# Patient Record
Sex: Female | Born: 1975 | Race: Black or African American | Hispanic: No | Smoking: Never smoker
Health system: Southern US, Community
[De-identification: ages and names within clinical notes are randomized; demographics above are authoritative.]

## PROBLEM LIST (undated history)

## (undated) DIAGNOSIS — K219 Gastro-esophageal reflux disease without esophagitis: Secondary | ICD-10-CM

## (undated) DIAGNOSIS — E0500 Thyrotoxicosis with diffuse goiter without thyrotoxic crisis or storm: Secondary | ICD-10-CM

## (undated) HISTORY — DX: Thyrotoxicosis with diffuse goiter without thyrotoxic crisis or storm: E05.00

## (undated) HISTORY — DX: Gastro-esophageal reflux disease without esophagitis: K21.9

---

## 1998-12-15 ENCOUNTER — Emergency Department (HOSPITAL_COMMUNITY): Admission: EM | Admit: 1998-12-15 | Discharge: 1998-12-15 | Payer: Self-pay | Admitting: Emergency Medicine

## 2001-02-11 ENCOUNTER — Ambulatory Visit (HOSPITAL_COMMUNITY): Admission: RE | Admit: 2001-02-11 | Discharge: 2001-02-11 | Payer: Self-pay | Admitting: Family Medicine

## 2001-02-11 ENCOUNTER — Encounter: Payer: Self-pay | Admitting: Family Medicine

## 2001-04-09 ENCOUNTER — Emergency Department (HOSPITAL_COMMUNITY): Admission: EM | Admit: 2001-04-09 | Discharge: 2001-04-10 | Payer: Self-pay | Admitting: Emergency Medicine

## 2001-09-04 ENCOUNTER — Emergency Department (HOSPITAL_COMMUNITY): Admission: EM | Admit: 2001-09-04 | Discharge: 2001-09-04 | Payer: Self-pay | Admitting: Emergency Medicine

## 2002-02-26 ENCOUNTER — Other Ambulatory Visit: Admission: RE | Admit: 2002-02-26 | Discharge: 2002-02-26 | Payer: Self-pay | Admitting: Internal Medicine

## 2004-07-05 ENCOUNTER — Ambulatory Visit: Payer: Self-pay | Admitting: Internal Medicine

## 2004-07-15 ENCOUNTER — Ambulatory Visit: Payer: Self-pay | Admitting: Internal Medicine

## 2005-10-17 ENCOUNTER — Ambulatory Visit: Payer: Self-pay | Admitting: Internal Medicine

## 2007-08-13 ENCOUNTER — Ambulatory Visit: Payer: Self-pay | Admitting: Internal Medicine

## 2007-08-13 DIAGNOSIS — M79609 Pain in unspecified limb: Secondary | ICD-10-CM | POA: Insufficient documentation

## 2007-08-14 ENCOUNTER — Encounter: Payer: Self-pay | Admitting: Internal Medicine

## 2007-08-14 LAB — CONVERTED CEMR LAB
ALT: 20 units/L (ref 0–35)
AST: 23 units/L (ref 0–37)
Albumin: 3 g/dL — ABNORMAL LOW (ref 3.5–5.2)
Alkaline Phosphatase: 126 units/L — ABNORMAL HIGH (ref 39–117)
Basophils Absolute: 0 10*3/uL (ref 0.0–0.1)
Basophils Relative: 0.3 % (ref 0.0–1.0)
Bilirubin, Direct: 0.1 mg/dL (ref 0.0–0.3)
Eosinophils Absolute: 0.2 10*3/uL (ref 0.0–0.6)
Eosinophils Relative: 1.7 % (ref 0.0–5.0)
HCT: 33.5 % — ABNORMAL LOW (ref 36.0–46.0)
Hemoglobin: 11.5 g/dL — ABNORMAL LOW (ref 12.0–15.0)
Lymphocytes Relative: 30.5 % (ref 12.0–46.0)
MCHC: 34.4 g/dL (ref 30.0–36.0)
MCV: 80.1 fL (ref 78.0–100.0)
Monocytes Absolute: 0.6 10*3/uL (ref 0.2–0.7)
Monocytes Relative: 6.1 % (ref 3.0–11.0)
Neutro Abs: 6.4 10*3/uL (ref 1.4–7.7)
Neutrophils Relative %: 61.4 % (ref 43.0–77.0)
Platelets: 361 10*3/uL (ref 150–400)
RBC: 4.19 M/uL (ref 3.87–5.11)
RDW: 16.3 % — ABNORMAL HIGH (ref 11.5–14.6)
Total Bilirubin: 0.5 mg/dL (ref 0.3–1.2)
Total Protein: 6.8 g/dL (ref 6.0–8.3)
WBC: 10.3 10*3/uL (ref 4.5–10.5)

## 2007-08-19 ENCOUNTER — Telehealth: Payer: Self-pay | Admitting: Internal Medicine

## 2007-08-29 DIAGNOSIS — E0500 Thyrotoxicosis with diffuse goiter without thyrotoxic crisis or storm: Secondary | ICD-10-CM

## 2007-08-29 HISTORY — DX: Thyrotoxicosis with diffuse goiter without thyrotoxic crisis or storm: E05.00

## 2007-09-18 ENCOUNTER — Encounter: Admission: RE | Admit: 2007-09-18 | Discharge: 2007-09-18 | Payer: Self-pay | Admitting: Obstetrics and Gynecology

## 2008-08-05 ENCOUNTER — Ambulatory Visit: Payer: Self-pay | Admitting: Internal Medicine

## 2008-08-05 DIAGNOSIS — J069 Acute upper respiratory infection, unspecified: Secondary | ICD-10-CM | POA: Insufficient documentation

## 2008-08-06 ENCOUNTER — Encounter: Admission: RE | Admit: 2008-08-06 | Discharge: 2008-08-06 | Payer: Self-pay | Admitting: Internal Medicine

## 2008-08-13 ENCOUNTER — Ambulatory Visit: Payer: Self-pay | Admitting: Internal Medicine

## 2008-08-14 LAB — CONVERTED CEMR LAB: TSH: 0.03 microintl units/mL — ABNORMAL LOW (ref 0.35–5.50)

## 2008-08-24 ENCOUNTER — Encounter: Payer: Self-pay | Admitting: Internal Medicine

## 2008-09-01 ENCOUNTER — Encounter (HOSPITAL_COMMUNITY): Admission: RE | Admit: 2008-09-01 | Discharge: 2008-09-02 | Payer: Self-pay | Admitting: Internal Medicine

## 2008-09-01 ENCOUNTER — Telehealth: Payer: Self-pay | Admitting: Internal Medicine

## 2008-09-03 ENCOUNTER — Telehealth: Payer: Self-pay | Admitting: Internal Medicine

## 2008-09-09 ENCOUNTER — Telehealth: Payer: Self-pay | Admitting: Internal Medicine

## 2008-09-16 ENCOUNTER — Ambulatory Visit: Payer: Self-pay | Admitting: Endocrinology

## 2008-09-17 ENCOUNTER — Telehealth: Payer: Self-pay | Admitting: Internal Medicine

## 2008-10-06 ENCOUNTER — Telehealth: Payer: Self-pay | Admitting: *Deleted

## 2008-10-07 ENCOUNTER — Ambulatory Visit (HOSPITAL_COMMUNITY): Admission: RE | Admit: 2008-10-07 | Discharge: 2008-10-07 | Payer: Self-pay | Admitting: Endocrinology

## 2008-10-22 ENCOUNTER — Encounter: Payer: Self-pay | Admitting: Endocrinology

## 2008-10-26 ENCOUNTER — Ambulatory Visit: Payer: Self-pay | Admitting: Endocrinology

## 2008-10-26 LAB — CONVERTED CEMR LAB
Free T4: 3.9 ng/dL — ABNORMAL HIGH (ref 0.6–1.6)
TSH: 0.06 microintl units/mL — ABNORMAL LOW (ref 0.35–5.50)

## 2008-11-02 ENCOUNTER — Ambulatory Visit: Payer: Self-pay | Admitting: Internal Medicine

## 2008-11-02 DIAGNOSIS — N61 Mastitis without abscess: Secondary | ICD-10-CM

## 2008-11-16 ENCOUNTER — Ambulatory Visit: Payer: Self-pay | Admitting: Endocrinology

## 2008-11-16 LAB — CONVERTED CEMR LAB: TSH: 0.06 microintl units/mL — ABNORMAL LOW (ref 0.35–5.50)

## 2008-11-20 ENCOUNTER — Telehealth (INDEPENDENT_AMBULATORY_CARE_PROVIDER_SITE_OTHER): Payer: Self-pay | Admitting: *Deleted

## 2008-12-16 ENCOUNTER — Ambulatory Visit: Payer: Self-pay | Admitting: Endocrinology

## 2008-12-16 DIAGNOSIS — R209 Unspecified disturbances of skin sensation: Secondary | ICD-10-CM

## 2008-12-16 LAB — CONVERTED CEMR LAB
BUN: 4 mg/dL — ABNORMAL LOW (ref 6–23)
Calcium: 9.1 mg/dL (ref 8.4–10.5)
Chloride: 111 meq/L (ref 96–112)
Creatinine, Ser: 0.7 mg/dL (ref 0.4–1.2)
Vitamin B-12: 548 pg/mL (ref 211–911)

## 2008-12-22 ENCOUNTER — Telehealth (INDEPENDENT_AMBULATORY_CARE_PROVIDER_SITE_OTHER): Payer: Self-pay | Admitting: *Deleted

## 2009-01-15 ENCOUNTER — Ambulatory Visit: Payer: Self-pay | Admitting: Endocrinology

## 2009-01-15 ENCOUNTER — Telehealth (INDEPENDENT_AMBULATORY_CARE_PROVIDER_SITE_OTHER): Payer: Self-pay | Admitting: *Deleted

## 2009-01-15 LAB — CONVERTED CEMR LAB
CO2: 27 meq/L (ref 19–32)
Chloride: 112 meq/L (ref 96–112)
Creatinine, Ser: 0.7 mg/dL (ref 0.4–1.2)
Glucose, Bld: 90 mg/dL (ref 70–99)
Sodium: 142 meq/L (ref 135–145)
TSH: 0.46 microintl units/mL (ref 0.35–5.50)

## 2009-01-21 ENCOUNTER — Telehealth: Payer: Self-pay | Admitting: Endocrinology

## 2009-03-02 ENCOUNTER — Ambulatory Visit: Payer: Self-pay | Admitting: Endocrinology

## 2009-03-05 ENCOUNTER — Ambulatory Visit: Payer: Self-pay | Admitting: Endocrinology

## 2009-03-05 LAB — CONVERTED CEMR LAB: TSH: 0.12 microintl units/mL — ABNORMAL LOW (ref 0.35–5.50)

## 2009-04-13 ENCOUNTER — Ambulatory Visit: Payer: Self-pay | Admitting: Endocrinology

## 2009-04-14 LAB — CONVERTED CEMR LAB: TSH: 0.22 microintl units/mL — ABNORMAL LOW (ref 0.35–5.50)

## 2009-06-10 ENCOUNTER — Telehealth: Payer: Self-pay | Admitting: Endocrinology

## 2009-06-11 ENCOUNTER — Ambulatory Visit: Payer: Self-pay | Admitting: Endocrinology

## 2009-06-11 DIAGNOSIS — L738 Other specified follicular disorders: Secondary | ICD-10-CM | POA: Insufficient documentation

## 2009-08-28 ENCOUNTER — Telehealth: Payer: Self-pay | Admitting: Family Medicine

## 2009-10-04 ENCOUNTER — Ambulatory Visit: Payer: Self-pay | Admitting: Endocrinology

## 2009-10-04 LAB — CONVERTED CEMR LAB: TSH: 0.26 microintl units/mL — ABNORMAL LOW (ref 0.35–5.50)

## 2009-10-09 ENCOUNTER — Ambulatory Visit: Payer: Self-pay | Admitting: Family Medicine

## 2009-10-09 ENCOUNTER — Encounter: Payer: Self-pay | Admitting: Endocrinology

## 2009-10-09 DIAGNOSIS — R05 Cough: Secondary | ICD-10-CM

## 2009-10-11 ENCOUNTER — Telehealth: Payer: Self-pay | Admitting: Endocrinology

## 2009-10-20 ENCOUNTER — Ambulatory Visit: Payer: Self-pay | Admitting: Endocrinology

## 2009-11-05 ENCOUNTER — Encounter: Payer: Self-pay | Admitting: Endocrinology

## 2009-11-29 ENCOUNTER — Telehealth: Payer: Self-pay | Admitting: Endocrinology

## 2009-11-29 ENCOUNTER — Ambulatory Visit: Payer: Self-pay | Admitting: Endocrinology

## 2009-11-29 DIAGNOSIS — R609 Edema, unspecified: Secondary | ICD-10-CM

## 2009-11-29 LAB — CONVERTED CEMR LAB
BUN: 5 mg/dL — ABNORMAL LOW (ref 6–23)
Creatinine, Ser: 0.7 mg/dL (ref 0.4–1.2)
Eosinophils Relative: 1.3 % (ref 0.0–5.0)
GFR calc non Af Amer: 122.02 mL/min (ref >60)
Lymphocytes Relative: 26.8 % (ref 12.0–46.0)
Monocytes Absolute: 0.5 10*3/uL (ref 0.1–1.0)
Monocytes Relative: 5 % (ref 3.0–12.0)
Neutrophils Relative %: 66.4 % (ref 43.0–77.0)
Platelets: 347 10*3/uL (ref 150.0–400.0)
TSH: 0.89 microintl units/mL (ref 0.35–5.50)
WBC: 9.3 10*3/uL (ref 4.5–10.5)

## 2009-11-30 ENCOUNTER — Encounter: Payer: Self-pay | Admitting: Endocrinology

## 2009-12-01 ENCOUNTER — Telehealth: Payer: Self-pay | Admitting: Endocrinology

## 2009-12-08 ENCOUNTER — Encounter: Payer: Self-pay | Admitting: Internal Medicine

## 2009-12-24 ENCOUNTER — Telehealth: Payer: Self-pay | Admitting: Endocrinology

## 2010-01-11 ENCOUNTER — Ambulatory Visit: Payer: Self-pay | Admitting: Endocrinology

## 2010-01-27 ENCOUNTER — Ambulatory Visit: Payer: Self-pay | Admitting: Internal Medicine

## 2010-04-18 ENCOUNTER — Ambulatory Visit: Payer: Self-pay | Admitting: Endocrinology

## 2010-04-19 ENCOUNTER — Ambulatory Visit: Payer: Self-pay | Admitting: Endocrinology

## 2010-04-25 ENCOUNTER — Telehealth: Payer: Self-pay | Admitting: Endocrinology

## 2010-04-26 ENCOUNTER — Encounter: Payer: Self-pay | Admitting: Internal Medicine

## 2010-04-27 ENCOUNTER — Ambulatory Visit: Payer: Self-pay | Admitting: Internal Medicine

## 2010-04-27 DIAGNOSIS — K219 Gastro-esophageal reflux disease without esophagitis: Secondary | ICD-10-CM | POA: Insufficient documentation

## 2010-05-02 LAB — CONVERTED CEMR LAB: IgE (Immunoglobulin E), Serum: 39.1 intl units/mL (ref 0.0–180.0)

## 2010-05-27 ENCOUNTER — Telehealth: Payer: Self-pay | Admitting: Internal Medicine

## 2010-06-27 ENCOUNTER — Ambulatory Visit: Payer: Self-pay | Admitting: Internal Medicine

## 2010-07-11 ENCOUNTER — Encounter: Payer: Self-pay | Admitting: Internal Medicine

## 2010-07-11 ENCOUNTER — Encounter: Payer: Self-pay | Admitting: Endocrinology

## 2010-08-26 ENCOUNTER — Ambulatory Visit: Payer: Self-pay | Admitting: Internal Medicine

## 2010-08-30 ENCOUNTER — Telehealth: Payer: Self-pay | Admitting: Internal Medicine

## 2010-09-14 ENCOUNTER — Ambulatory Visit: Admit: 2010-09-14 | Payer: Self-pay | Admitting: Internal Medicine

## 2010-09-15 ENCOUNTER — Telehealth: Payer: Self-pay | Admitting: Internal Medicine

## 2010-09-18 ENCOUNTER — Encounter: Payer: Self-pay | Admitting: Obstetrics and Gynecology

## 2010-09-18 ENCOUNTER — Encounter: Payer: Self-pay | Admitting: Internal Medicine

## 2010-09-19 ENCOUNTER — Encounter: Payer: Self-pay | Admitting: Obstetrics and Gynecology

## 2010-09-27 NOTE — Progress Notes (Signed)
Summary: Gwendolyn Scott  Phone Note Other Incoming   Caller: [employer] Gwendolyn Scott Lona Kettle [phone] Summary of Call: [employer] called to request that MD call on either 05/02 or 05/03 to discuss pt "Gwendolyn Scott" . If MD is available please contact Regina at the above number. Initial call taken by: Margaret Pyle, CMA,  December 24, 2009 3:11 PM  Follow-up for Phone Call        dr Felipa Furnace calls 12/27/09.  i told him i do not have a release of info to speak with him. Follow-up by: Minus Breeding MD,  Dec 27, 2009 2:55 PM

## 2010-09-27 NOTE — Letter (Signed)
Summary: Endocrinology/[facility]  Endocrinology/[facility]   Imported By: Lester Mesita 07/19/2010 07:12:13  _____________________________________________________________________  External Attachment:    Type:   Image     Comment:   External Document

## 2010-09-27 NOTE — Progress Notes (Signed)
Summary: Call Report  Phone Note Other Incoming   Caller: [facility] Call Report Summary of Call: [facility] Triage Call Report Triage Record Num: [id number] Operator: Caswell Corwin Patient Name: Gwendolyn Scott Call Date & Time: 11/27/2009 9:15:48AM Patient Phone: [phone] PCP: Romero Belling Patient Gender: Female PCP Fax : [phone] Patient DOB: 20-Jan-1976 Practice Name: [facility] Reason for Call: Pt calling that she ahs edema in hands and feet x 1 weeks and has some blood in her sputum. Also has tingling up to knees when dependent. Triaged edema and not able to do ADL's. Inst for home care and call back inst. Transferred to office for appt in 4 hrs. Protocol(s) Used: Edema, Generalized Atraumatic Recommended Outcome per Protocol: See Provider within 4 hours Reason for Outcome: Experienced sudden decrease in ability to carry out ADLs Care Advice:  ~ Call provider if symptoms worsen or new symptoms develop.  ~ Avoid any activity that produces symptoms until evaluated by provider.  ~ List, or take, all current prescription(s), OTC or alternative medication(s) to provider for evaluation.  ~ Rest in a reclining chair or elevate head and chest on 2 or 3 pillows when lying down to make breathing easier. Go to ED IMMEDIATELY if developing increased shortness of breath, continuous cough, worsening fatigue, or unable to perform ADLs.  ~  Initial call taken by: Margaret Pyle, CMA,  November 29, 2009 8:05 AM

## 2010-09-27 NOTE — Letter (Signed)
Summary: Pt's job Description/[facility]  Pt's job Description/[facility]   Imported By: Sherian Rein 11/10/2009 08:25:39  _____________________________________________________________________  External Attachment:    Type:   Image     Comment:   External Document

## 2010-09-27 NOTE — Consult Note (Signed)
Summary: [facility]  [facility]   Imported By: Maryln Gottron 01/18/2010 13:36:04  _____________________________________________________________________  External Attachment:    Type:   Image     Comment:   External Document

## 2010-09-27 NOTE — Assessment & Plan Note (Signed)
Summary: TINGLING LEGS-LEG ANKLE SWELLING-LITTLE BIT BLOOD IN [facility]   Vital Signs:  Patient profile:   35 year old female Height:      61 inches (154.94 cm) Weight:      238 pounds (108.18 kg) O2 Sat:      97 % on Room air Temp:     98.5 degrees F (36.94 degrees C) oral Pulse rate:   96 / minute BP sitting:   112 / 78  (left arm) Cuff size:   large  Vitals Entered By: Josph Macho RMA (November 29, 2009 8:48 AM)  O2 Flow:  Room air CC: Pt states she has tingling in her legs, legs & ankles swollen, and a little bit of blood in saliva X4days/ CF Is Patient Diabetic? No   CC:  Pt states she has tingling in her legs, legs & ankles swollen, and and a little bit of blood in saliva X4days/ CF.  History of Present Illness: pt states 1 week of slight swelling of the hands and feet, and associated tingling. she also has bloody saliva, which she feels is of nasopharyngeal, rather than chest, origin.    Current Medications (verified): 1)  Depo-Provera 150 Mg/ml Susp (Medroxyprogesterone Acetate) .... Take 1 Shot Q 3 Months 2)  Azithromycin 500 Mg Tabs (Azithromycin) .[name].. 1 Qd 3)  Benzonatate 100 Mg Caps (Benzonatate) .[name].. 1 Three Times A Day As Needed Cough  Allergies (verified): 1)  ! Penicillin V Potassium (Penicillin V Potassium)  Past History:  Past Medical History: Last updated: 10/04/2009 COUGH (ICD-786.2) FOLLICULITIS (ICD-704.8) ENCOUNTER FOR LONG-TERM USE OF OTHER MEDICATIONS (ICD-V58.69) HYPOTHYROIDISM, POST-RADIATION (ICD-244.1) NUMBNESS (ICD-782.0) INFLAMMATORY DISEASE OF BREAST (ICD-611.0) GOITER, DIFFUSE (ICD-240.9) URI (ICD-465.9) ARM PAIN (ICD-729.5)  Review of Systems  The patient denies dyspnea on exertion and prolonged cough.    Physical Exam  General:  obese.  no distress  Head:  head: no deformity eyes: no periorbital swelling, no proptosis external nose and ears are normal mouth: no lesion seen Neck:  i do not appreciate a goiter. Lungs:   Clear to auscultation bilaterally. Normal respiratory effort.  Msk:  hands are normal to my exam gait is normal and steady Extremities:  no edema Neurologic:  sensation is intact to touch on the hands Additional Exam:  FastTSH                   0.89 uIU/mL                 0.35-5.50   B-Type Natriuetic Peptide    11.0 pg/mL                  0.0-100.0    Sodium                    144 mEq/L                   135-145   Potassium            [L]  3.4 mEq/L                   3.5-5.1   Chloride                  108 mEq/L                   96-112   Carbon Dioxide            27 mEq/L  19-32   Glucose              [H]  119 mg/dL                   10-62   BUN                  [L]  5 mg/dL                     6-94   Creatinine                0.7 mg/dL                   8.5-4.6   Calcium                   9.0 mg/dL           Impression & Recommendations:  Problem # 1:  HYPERTHYROIDISM (ICD-242.90) Assessment Improved  Problem # 2:  EDEMA (ICD-782.3) not confirmed on exam or labs  Problem # 3:  bloody sputum of nasopharyngeal origin  Other Orders: T-D-Dimer Fibrin Derivatives Quantitive [phone]) TLB-TSH (Thyroid Stimulating Hormone) (84443-TSH) TLB-BNP (B-Natriuretic Peptide) (83880-BNPR) TLB-BMP (Basic Metabolic Panel-BMET) (80048-METABOL) TLB-CBC Platelet - w/Differential (85025-CBCD) Est. Patient Level IV (18299)  Patient Instructions: 1)  tests are being ordered for you today.  a few days after the test(s), please call [phone] to hear your test results. 2)  (update: i left message on phone-tree:  no rx needed now).

## 2010-09-27 NOTE — Progress Notes (Signed)
----   Converted from flag ---- ---- 04/24/2010 4:49 PM, Minus Breeding MD wrote: she sees dr in [place], so sounds like we are done  ---- 04/19/2010 5:02 PM, Hilarie Fredrickson wrote: Renne Musca WAS A NO-SHOW YESTERDAY AND TODAY.  SHE HAS CANCELLED 2 APPTS AND NO-SHOWED 5 APPTS SINCE FEB. 18, 2011.  DO YOU WANT [place] TO RE-SCHEDULE HER? PHONE [phone] ------------------------------  Phone Note Call from Patient    Follow-up for Phone Call        NOTED IN IDX "DO NOT SCHEDULE". Follow-up by: Hilarie Fredrickson,  April 25, 2010 9:16 AM

## 2010-09-27 NOTE — Progress Notes (Signed)
Summary: Letter  Phone Note Call from Patient Call back at Home Phone [phone]   Caller: Patient Summary of Call: Pt called stating that work excuse was not accepted by her employer because it needed to have her next scheduled work date as her return to work day. Pt states that she is off from work and does not return until 04/14. Pt is requesting a letter medically clearing her to return to work without retriction on April 14th. Initial call taken by: Margaret Pyle, CMA,  December 01, 2009 4:26 PM  Follow-up for Phone Call        sorry, i cannot certify disability until that date. Follow-up by: Minus Breeding MD,  December 01, 2009 5:23 PM  Additional Follow-up for Phone Call Additional follow up Details #1::        Left message for pt to return my call. Additional Follow-up by: Josph Macho RMA,  December 02, 2009 4:09 PM    Additional Follow-up for Phone Call Additional follow up Details #2::    pt informed Follow-up by: Margaret Pyle, CMA,  December 03, 2009 9:46 AM

## 2010-09-27 NOTE — Assessment & Plan Note (Signed)
Summary: poss sinus infection/jd   Primary Provider/Referring Provider:  Dr Cato Mulligan  CC:  Acute Visit.  pressure in cheeks and above eyes and nasal congestion with yellow to small amount of bright red blood - onset "Sunday.  Denies f/c/s..  History of Present Illness: History of Present Illness: April 27, 2010- 35yoF self-referred for allergy evaluation. She has had occasional feeling of throat swelling. Episodes usually come on in summertime, last about a week, respond to Dayquil. This has been happening 9-10 years and she associates it with outdoor exposure to weather changes and heat. With this she will sometimes get nasal congestion and drainage, sneezing, minor chest tightness, ears draining and frontal headache.  Grass mwing and colognes may cause or aggravate nasal syptoms. Denies sensitivity to molds, foods, medications including aspirin, latex or contrast.  Had ear infections as child but never had tubes, skin tests or prescription allergy meds.  No ENT surgery.Has GERD which doesn't wake her, treated with Tums. Never smoked. Had RAI for thyroid disease.  June 27, 2010- Allergic rhinitis, GERD Acute visit. 2 days of marked head congestion. She blames several days of being in and out of doors a lot with weather changes. Notes stopped up nose, frontal presure, not draining much. Ears and chest are ok. No sore throat or fever. Using Omnaris. Did not return for skin testing.  Allergy profile- Total IgE 39.1; no specific IgE elevations for common inhalant allergens.has never had flu or pneumococcal vaccine and not interested.   Preventive Screening-Counseling & Management  Alcohol-Tobacco     Smoking Status: never  Current Medications (verified): 1)  Depo-Provera 150 Mg/ml Susp (Medroxyprogesterone Acetate) .... Take 1 Shot Q 3 Months 2)  Methimazole 5 Mg Tabs (Methimazole) .... Take 1/2 By Mouth Once Daily 3)  Omnaris 50 Mcg/act Susp (Ciclesonide) .... 1-2 Puffs Each Nostril Every  Night At Bedtime 4)  Omeprazole 20 Mg Cpdr (Omeprazole) .... 1 Each Morning Before Breakfast.  Allergies (verified): 1)  ! Penicillin V Potassium (Penicillin V Potassium)  Past History:  Past Medical History: Last updated: 04/27/2010 COUGH (ICD-786.2) Allergic rhinitis FOLLICULITIS (ICD-704.8) ENCOUNTER FOR LONG-TERM USE OF OTHER MEDICATIONS (ICD-V58.69) HYPOTHYROIDISM, POST-RADIATION (ICD-244.1) NUMBNESS (ICD-782.0) INFLAMMATORY DISEASE OF BREAST (ICD-611.0) GOITER, DIFFUSE (ICD-240.9) URI (ICD-465.9) ARM PAIN (ICD-729.5)  Past Surgical History: Last updated: 04/27/2010 None  Family History: Last updated: 04/27/2010 mother has hyperthyroidism Allergies: grandmother and mother Asthma: grandmother and mother  Social History: Last updated: 04/27/2010 divorced Never Smoked 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  The patient denies shortness of breath with activity, shortness of breath at rest, productive cough, non-productive cough, coughing up blood, chest pain, irregular heartbeats, acid heartburn, indigestion, loss of appetite, weight change, abdominal pain, difficulty swallowing, sore throat, tooth/dental problems, headaches, itching, anxiety, rash, change in color of mucus, and fever.    Vital Signs:  Patient profile:   35 year old female Height:      61 inches Weight:      249.13 pounds O2 Sat:      98"  % on Room air Pulse rate:   80 / minute BP sitting:   122 / 76  (right arm) Cuff size:   large  Vitals Entered By: Gweneth Dimitri RN (June 27, 2010 3:40 PM)  O2 Flow:  Room air CC: Acute  Visit.  pressure in cheeks and above eyes, nasal congestion with yellow to small amount of bright red blood - onset Sunday.  Denies f/c/s. Comments Medications reviewed with  patient Daytime contact number verified with patient. Gweneth Dimitri RN  June 27, 2010 3:41 PM    Physical Exam  Additional Exam:  General: A/Ox3; pleasant and cooperative, NAD, pleasant affect SKIN: no rash, lesions NODES: no lymphadenopathy HEENT: [place]/AT, EOM- WNL, Conjuctivae- clear, PERRLA, TM-WNL, Nose- marked turbinate edema, Throat- clear and wnl, Mallampati  III, no drainage or erythema NECK: Supple w/ fair ROM, JVD- none, normal carotid impulses w/o bruits Thyroid-, no stridor CHEST: Clear to P&A HEART: RRR, no m/g/r heard ABDOMEN: overweight ZOX:WRUE, nl pulses, no edema  NEURO: Grossly intact to observation      Impression & Recommendations:  Problem # 1:  ? of ALLERGIC RHINITIS (ICD-477.9)  Exacerbation of nonspecific rhinitis. This may be more of a vasomotor rhinitis currently. I don't see a role yet for antibiotics. We can skin test later if indicated. For today I will try to get her open with a neb and depo, to where here Omnaris can regain control.  Her updated medication list for this problem includes:    Omnaris 50 Mcg/act Susp (Ciclesonide) .[name]... 1-2 puffs each nostril every night at bedtime  Medications Added to Medication List This Visit: 1)  Methimazole 5 Mg Tabs (Methimazole) .... Take 1/2 by mouth once daily  Other Orders: Est. Patient Level III (45409) Admin of Therapeutic Inj  intramuscular or subcutaneous (81191) Depo- Medrol 80mg  (J1040) Nebulizer Tx (47829) Influenza Vaccine NON MCR (56213)  Patient Instructions: 1)  Please schedule a follow-up appointment in 2 months. Call sooner if needed. 2)  neb neo nasal 3)  depo 80 4)  Continue using Omnaris nasal spray 1-2 sprays each nostril once daily at  bedtime.  5)  Flu vax   Immunizations Administered:  Influenza Vaccine # 1:    Vaccine Type: Fluvax Non-MCR    Site: left deltoid    Mfr: novartis    Dose: 0.5 ml    Route: IM    Given by: Reynaldo Minium CMA    Exp. Date: 01/27/2011     Lot #: [id number]    VIS given: 03/22/10 version given June 27, 2010.  Flu Vaccine Consent Questions:    Do you have a history of severe allergic reactions to this vaccine? no    Any prior history of allergic reactions to egg and/or gelatin? no    Do you have a sensitivity to the preservative Thimersol? no    Do you have a past history of Guillan-Barre Syndrome? no    Do you currently have an acute febrile illness? no    Have you ever had a severe reaction to latex? no    Vaccine information given and explained to patient? yes    Are you currently pregnant? no    Medication Administration  Injection # 1:    Medication: Depo- Medrol 80mg     Diagnosis: ? of ALLERGIC RHINITIS (ICD-477.9)    Route: SQ    Site: RUOQ gluteus    Exp Date: 01/2013    Lot #: [id number]    Mfr: Pharmacia    Patient tolerated injection without complications    Given by: Reynaldo Minium CMA (June 27, 2010 5:03 PM)  Medication # 1:    Medication: EMR miscellaneous medications    Diagnosis: ? of ALLERGIC RHINITIS (ICD-477.9)    Dose: 3 drops  Route: intranasal    Exp Date: 11/2011    Lot #: [id number]    Mfr: bayer    Comments: Neo-synephrine    Patient tolerated medication without complications    Given by: Reynaldo Minium CMA (June 27, 2010 5:04 PM)  Orders Added: 1)  Est. Patient Level III [11914] 2)  Admin of Therapeutic Inj  intramuscular or subcutaneous [96372] 3)  Depo- Medrol 80mg  [J1040] 4)  Nebulizer Tx [94640] 5)  Influenza Vaccine NON MCR [00028]

## 2010-09-27 NOTE — Letter (Signed)
Summary: [facility]   [facility]   Imported By: Maryln Gottron 07/27/2010 12:35:13  _____________________________________________________________________  External Attachment:    Type:   Image     Comment:   External Document

## 2010-09-27 NOTE — Letter (Signed)
Summary: [facility]  [facility] -[facility]   Imported By: Maryln Gottron 06/07/2010 13:12:39  _____________________________________________________________________  External Attachment:    Type:   Image     Comment:   External Document

## 2010-09-27 NOTE — Assessment & Plan Note (Signed)
Summary: sore throat, achey/cd   Vital Signs:  Patient profile:   35 year old female Height:      61 inches (154.94 cm) Weight:      238 pounds (108.18 kg) O2 Sat:      97 % on Room air Temp:     99.2 degrees F (37.33 degrees C) oral Pulse rate:   102 / minute BP sitting:   126 / 88  (left arm) Cuff size:   large  Vitals Entered By: Josph Macho CMA (October 04, 2009 10:54 AM)  O2 Flow:  Room air CC: Sore throat, "achy" feeling, and green mucus/ pt states the only medication she is taking is the Depo-Provera/  CF Is Patient Diabetic? No   CC:  Sore throat, "achy" feeling, and and green mucus/ pt states the only medication she is taking is the Depo-Provera/  CF.  History of Present Illness: pt had i-131 rx for hyperthyroidism, and has not required any thyroid medication recently (synthroid was stopped). pt states few days of sore throat, nasal congestion, and associated moderate fever (101). she says the folliculitis at her right axilla is much better.  Current Medications (verified): 1)  Depo-Provera 150 Mg/ml Susp (Medroxyprogesterone Acetate) .... Take 1 Shot Q 3 Months 2)  Spironolactone 25 Mg Tabs (Spironolactone) .... Take 2 By Mouth Qd 3)  Lactulose 10 Gm/34ml Soln (Lactulose) .... 3 Tablespoons Tid 4)  Metoprolol Succinate 25 Mg Xr24h-Tab (Metoprolol Succinate) .... 1/2 Once Daily 5)  Doxycycline Hyclate 100 Mg Caps (Doxycycline Hyclate) .[name].. 1 Bid  Allergies (verified): 1)  ! Penicillin V Potassium (Penicillin V Potassium)  Past History:  Past Medical History: COUGH (ICD-786.2) FOLLICULITIS (ICD-704.8) ENCOUNTER FOR LONG-TERM USE OF OTHER MEDICATIONS (ICD-V58.69) HYPOTHYROIDISM, POST-RADIATION (ICD-244.1) NUMBNESS (ICD-782.0) INFLAMMATORY DISEASE OF BREAST (ICD-611.0) GOITER, DIFFUSE (ICD-240.9) URI (ICD-465.9) ARM PAIN (ICD-729.5)  Review of Systems       denies earache.  she has a dry cough.    Physical Exam  General:  obese.  no  distress  Head:  head: no deformity eyes: no periorbital swelling, no proptosis external nose and ears are normal mouth: no lesion seen Lungs:  Clear to auscultation bilaterally. Normal respiratory effort.  Skin:  right axilla: no rash Additional Exam:   FastTSH              [L]  0.26 uIU/mL    Impression & Recommendations:  Problem # 1:  HYPERTHYROIDISM (ICD-242.90) recurrent, mild  Problem # 2:  URI (ICD-465.9)  Problem # 3:  FOLLICULITIS (ICD-704.8) Assessment: Improved  Medications Added to Medication List This Visit: 1)  Azithromycin 500 Mg Tabs (Azithromycin) .[name].. 1 qd 2)  Benzonatate 100 Mg Caps (Benzonatate) .[name].. 1 three times a day as needed cough  Other Orders: Est. Patient Level III ([address]) Est. Patient Level IV (60454) TLB-TSH (Thyroid Stimulating Hormone) (84443-TSH)  Patient Instructions: 1)  azithromycin 500 mg once daily 2)  loratadine-d (non-prescription) as needed for congestion 3)  benzonatate 100 mg three times a day as needed for cough. 4)  carefully wash your right underarm at least daily. 5)  thyroid blood test today. 6)  tests are being ordered for you today.  a few days after the test(s), please call [phone] to hear your test results. 7)  return 6 months 8)  (update: i left message on phone-tree:  ret 3 mos.  you may need another i-131 rx.) Prescriptions: BENZONATATE 100 MG CAPS (BENZONATATE) 1 three times a day as needed cough  #  30 x 1   Entered and Authorized by:   Minus Breeding MD   Signed by:   Minus Breeding MD on 10/04/2009   Method used:   Electronically to        [facility] (retail)       [address]       [place], [place]  [address]       Ph: [phone]       Fax: [phone]   RxID:   [id number] AZITHROMYCIN 500 MG TABS (AZITHROMYCIN) 1 qd  #6 x 0   Entered and Authorized by:   Minus Breeding MD   Signed by:   Minus Breeding MD on 10/04/2009   Method used:   Electronically to         [facility] (retail)       [address]       [place], [place]  [address]       Ph: [phone]       Fax: [phone]   RxID:   [id number]

## 2010-09-27 NOTE — Letter (Signed)
Summary: Out of Work  [facility]  [address] [place], [place] [address]   Phone: [phone]  Fax: [phone]    November 30, 2009   Employee:  JULIANI LADUKE    To Whom It May Concern:   For Medical reasons, please excuse the above named employee from work for the following dates:  Start: 11/29/09    End:  12/01/09         Sincerely,    Minus Breeding MD

## 2010-09-27 NOTE — Progress Notes (Signed)
Summary: nos appt  Phone Note Call from Patient   Caller: [email or web address]  Call For: Starlena Beil Summary of Call: LMTCB x2 to rsc nos from 9/29. Initial call taken by: Darletta Moll,  May 27, 2010 3:41 PM

## 2010-09-27 NOTE — Assessment & Plan Note (Signed)
Summary: BAD COUGH--WAS SEEN ON MONDAY//VGJ   Vital Signs:  Patient profile:   35 year old female Weight:      237 pounds Temp:     98.0 degrees F oral BP sitting:   122 / 84  (left arm) Cuff size:   large  Vitals Entered By: Alfred Levins, CMA (October 09, 2009 10:12 AM) CC: cough x5 days   History of Present Illness: Pt here from working in the clinic. She is having significant cough. She is not heving fever or chills, no ear pain, no headache, min rhinitis, no ST, significant cough minimally productive clear to white phlegm. She has chest congestion. She has taken Zmax, taking Tessalon regularly, is taking Loratidine.  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 Take 1 Shot Q 3 Months 2)  Spironolactone 25 Mg Tabs (Spironolactone) .... Take 2 By Mouth Qd 3)  Lactulose 10 Gm/7ml Soln (Lactulose) .... 3 Tablespoons Tid 4)  Metoprolol Succinate 25 Mg Xr24h-Tab (Metoprolol Succinate) .... 1/2 Once Daily 5)  Doxycycline Hyclate 100 Mg Caps (Doxycycline Hyclate) .[name].. 1 Bid 6)  Azithromycin 500 Mg Tabs (Azithromycin) .[name].. 1 Qd 7)  Benzonatate 100 Mg Caps (Benzonatate) .[name].. 1 Three Times A Day As Needed Cough  Allergies: 1)  ! Penicillin V Potassium (Penicillin V Potassium)  Physical Exam  General:  morbidly obese.  no distress, coughinbg frequently.  Head:  normocephalic and atraumatic. Sinuses NT.  Eyes:  Conjunctiva clear bilaterally.  Ears:  External ear exam shows no significant lesions or deformities.  Otoscopic examination reveals clear canals, tympanic membranes are intact bilaterally without  bulging, retraction, inflammation or discharge. Hearing is grossly normal bilaterally. Nose:  External nasal examination shows no deformity or inflammation. Nasal mucosa are pink and moist without lesions or exudates. Minimal inflamm bilat. Mouth:  Oral mucosa and oropharynx without lesions or exudates.  Teeth in good repair. Neck:  No deformities, masses, or tenderness noted. Lungs:  Normal respiratory effort, chest expands symmetrically. Lungs are clear to auscultation, no crackles or wheezes. Heart:  Normal rate and regular rhythm. S1 and S2 normal without gallop, murmur, click, rub or other extra sounds.   Impression & Recommendations:  Problem # 1:  COUGH (ICD-786.2) See instructions.  Complete Medication List: 1)  Depo-provera 150 Mg/ml Susp (Medroxyprogesterone acetate) .... Take 1 shot q 3 months 2)  Spironolactone 25 Mg Tabs (Spironolactone) .... Take 2 by mouth qd 3)  Lactulose 10 Gm/56ml Soln (Lactulose) .... 3 tablespoons tid 4)  Metoprolol Succinate 25 Mg Xr24h-tab (Metoprolol succinate) .... 1/2 once daily 5)  Doxycycline Hyclate 100 Mg Caps (Doxycycline hyclate) .[name].. 1 bid 6)  Azithromycin 500 Mg Tabs (Azithromycin) .[name].. 1 qd 7)  Benzonatate 100 Mg Caps (Benzonatate) .[name].. 1 three times a day as needed cough  Patient Instructions: 1)  Take Tessalon three times a day  2)  Take Guaifenesin by going to [facility] or [facility] and getting MUCOUS RELIEF EXPECTORANT (400mg ), take 11/2 tabs by mouth AM and NOON. 3)  Drink lots of fluids anytime taking Guaifenesin.  4)  Keep Lozenge in mouth. 5)  Gargle every 1/2 hr  with warm salt water.  6)  Tussionex atnight.

## 2010-09-27 NOTE — Assessment & Plan Note (Signed)
Summary: allergies/jd   Vital Signs:  Patient profile:   35 year old female Height:      61 inches Weight:      247.13 pounds BMI:     46.86 O2 Sat:      98 % on Room air Pulse rate:   74 / minute BP sitting:   110 / 70  (left arm) Cuff size:   large  Vitals Entered By: Reynaldo Minium CMA (April 27, 2010 9:18 AM)  O2 Flow:  Room air CC: Allergy new pt-self referral   Primary Provider/Referring Provider:  Dr Cato Mulligan  CC:  Allergy new pt-self referral.  History of Present Illness: April 27, 2010- 35yoF self-referred for allergy evaluation. She has had occasional feeling of throat swelling. Episodes usually come on in summertime, last about a week, respond to Dayquil. This has been happening 9-10 years and she associates it with outdoor exposure to weather changes and heat. With this she will sometimes get nasal congestion and drainage, sneezing, minor chest tightness, ears draining and frontal headache.  Grass mwing and colognes may cause or aggravate nasal syptoms. Denies sensitivity to molds, foods, medications including aspirin, latex or contrast.  Had ear infections as child but never had tubes, skin tests or prescription allergy meds.  No ENT surgery.Has GERD which doesn't wake her, treated with Tums. Never smoked. Had RAI for thyroid disease.  Preventive Screening-Counseling & Management  Alcohol-Tobacco     Smoking Status: never     Tobacco Counseling: not indicated; no tobacco use  Current Medications (verified): 1)  Depo-Provera 150 Mg/ml Susp (Medroxyprogesterone Acetate) .... Take 1 Shot Q 3 Months 2)  Methimazole 5 Mg Tabs (Methimazole) .... Take 1 By Mouth Once Daily  Allergies (verified): 1)  ! Penicillin V Potassium (Penicillin V Potassium)  Past History:  Family History: Last updated: 04/27/2010 mother has hyperthyroidism Allergies: grandmother and mother Asthma: grandmother and mother  Social History: Last updated: 04/27/2010 divorced Never  Smoked works at [employer] Lives with Grandparents  Risk Factors: Smoking Status: never (04/27/2010)  Past Medical History: COUGH (ICD-786.2) Allergic rhinitis FOLLICULITIS (ICD-704.8) ENCOUNTER FOR LONG-TERM USE OF OTHER MEDICATIONS (ICD-V58.69) HYPOTHYROIDISM, POST-RADIATION (ICD-244.1) NUMBNESS (ICD-782.0) INFLAMMATORY DISEASE OF BREAST (ICD-611.0) GOITER, DIFFUSE (ICD-240.9) URI (ICD-465.9) ARM PAIN (ICD-729.5)  Past Surgical History: None  Family History: mother has hyperthyroidism Allergies: grandmother and mother Asthma: grandmother and mother  Social History: divorced Never Smoked works at [employer] with Grandparents  Review of Systems      See HPI       The patient complains of weight change, difficulty swallowing, sore throat, nasal congestion/difficulty breathing through nose, and ear ache.  The patient denies shortness of breath with activity, shortness of breath at rest, productive cough, non-productive cough, coughing up blood, chest pain, irregular heartbeats, acid heartburn, indigestion, loss of appetite, abdominal pain, tooth/dental problems, headaches, sneezing, itching, anxiety, depression, hand/feet swelling, joint stiffness or pain, rash, change in color of mucus, and fever.    Physical Exam  Additional Exam:  General: A/Ox3; pleasant and cooperative, NAD, pleasant affect SKIN: no rash, lesions NODES: no lymphadenopathy HEENT: [place]/AT, EOM- WNL, Conjuctivae- clear, PERRLA, TM-WNL, Nose- clear, Throat- clear and wnl, Mallampati  III, no drainage or erythema NECK: Supple w/ fair ROM, JVD- none, normal carotid impulses w/o bruits Thyroid-, no stridor CHEST: Clear to P&A HEART: RRR, no m/g/r heard ABDOMEN: Soft and nl; nml bowel sounds; no organomegaly or masses noted, overweight YQM:VHQI, nl pulses, no edema  NEURO: Grossly  intact to observation      Impression & Recommendations:  Problem # 1:  ? of ALLERGIC  RHINITIS (ICD-477.9)  The nasal congestion, blowing and sneezing, and frontal headache are suggestive. The midsummer peak season is more like an air quality irritant effect. We will look for IgE activation. Discussed environmental precautions and will give a sample steroid nasal spray.  Her updated medication list for this problem includes:    Omnaris 50 Mcg/act Susp (Ciclesonide) .[name]... 1-2 puffs each nostril every night at bedtime  Problem # 2:  GERD (ICD-530.81)  She freely admits reflux/ heartburn, which is treated with TUMS. Denies nocturnal choking/ strangling.This may explain or contribute to her throat complaints. We discussed reflux and suggested trial of an otc acid blocker. Se does say her throat flares usually at the same time as ner nasal congestion.  Her updated medication list for this problem includes:    Omeprazole 20 Mg Cpdr (Omeprazole) .[name]... 1 each morning before breakfast.  Problem # 3:  HYPERTHYROIDISM (ICD-242.90) I don't think her previous thyroid disease is the basis for intermittent throat symptoms now, unless simple dryness is part of her complaint. Her updated medication list for this problem includes:    Methimazole 5 Mg Tabs (Methimazole) .[name]... Take 1 by mouth once daily  Medications Added to Medication List This Visit: 1)  Methimazole 5 Mg Tabs (Methimazole) .... Take 1 by mouth once daily 2)  Omnaris 50 Mcg/act Susp (Ciclesonide) .[name].. 1-2 puffs each nostril every night at bedtime 3)  Omeprazole 20 Mg Cpdr (Omeprazole) .[name].. 1 each morning before breakfast.  Other Orders: Consultation Level IV (33295) T-Allergy Profile [place], [place], [place], , [place] [phone])  Patient Instructions: 1)  Return as able for allergy skin testing. ;Stop all antihistamines 3 days before skin testing, including cold and allergy meds, otc sleep and cough meds.  2)  Lab 3)  Sample Omnaris nasal spray-: 1 or 2 puffs each nostril, once daily every night at bedtime 4)  Try otc acid blocker  Omeprazole 20 mg, 1 daily before breakfast. Try it for a month to see if it prevents acid reflux/ heart burn. Does it help with your throat problems? Prescriptions: OMEPRAZOLE 20 MG CPDR (OMEPRAZOLE) 1 each morning before breakfast.  #1 bottle x prn   Entered and Authorized by:   Waymon Budge MD   Signed by:   Waymon Budge MD on 04/27/2010   Method used:   Historical   RxID:   [id number] OMNARIS 50 MCG/ACT SUSP (CICLESONIDE) 1-2 puffs each nostril every night at bedtime  #1 x 0   Entered and Authorized by:   Waymon Budge MD   Signed by:   Waymon Budge MD on 04/27/2010   Method used:   Samples Given   RxID:   [id number]

## 2010-09-27 NOTE — Letter (Signed)
Summary: Call A Nurse  Call A Nurse   Imported By: Sherian Rein 10/13/2009 13:12:36  _____________________________________________________________________  External Attachment:    Type:   Image     Comment:   External Document

## 2010-09-27 NOTE — Progress Notes (Signed)
Summary: Call Report  Phone Note Other Incoming   Caller: Call-A-Nurse Call Report Summary of Call: pt called 10/09/2009 @ 8:41am stating that she has persistant cough even with Benzonatate. pt was advised to see MD within 24hrs. pt told to call at 9am to schedule. pt made appt same day with Dr. Hetty Ely @ 9:am Initial call taken by: Margaret Pyle, CMA,  October 11, 2009 10:13 AM

## 2010-09-27 NOTE — Progress Notes (Signed)
  Phone Note Call from Patient Call back at [facility] Phone [phone]   Caller: Patient Summary of Call: Onset yesterday of hive lesions diffusely on body.  She questions food allergy though known prior hx.  No recent new meds, change of detergents, or any other obvious precipitants.  Has noted taken any meds for relief.  No dyspnea, wheezing, or angioedema symptoms.  Pt advised to try OTC antihistamine such as Zyrtec and may add Pepcid/Zantac as needed. Initial call taken by: Evelena Peat MD,  August 28, 2009 8:43 AM

## 2010-09-27 NOTE — Progress Notes (Signed)
Summary: work release  Phone Note Call from Patient   Caller: Patient [phone] Summary of Call: pt called requesting a note from MD releasing her back to work with no restrictions.Pt is not scheduled to retun to work until 12/09/2009. Initial call taken by: Margaret Pyle, CMA,  November 29, 2009 10:34 AM  Follow-up for Phone Call        on what day did you become unable to work?  Additional Follow-up for Phone Call Additional follow up Details #1::        left message on machine for pt to return my call   pt's start date for work note would be today. pt left early for appt due to leg edema Additional Follow-up by: Margaret Pyle, CMA,  November 29, 2009 2:43 PM    Additional Follow-up for Phone Call Additional follow up Details #2::    i called pt 11/30/09.  i left message on ans machine.  labs do not indicate a need for further absence from work.  i wrote note for rtw 12/01/09. Follow-up by: Minus Breeding MD,  November 30, 2009 9:02 AM  Additional Follow-up for Phone Call Additional follow up Details #3:: Details for Additional Follow-up Action Taken: pt is not scheduled to work until 04/14 (pre-scheduled time off). Per pt her employer, pt will need clearance from MD because she left work early due to edema for OV 04/04. Margaret Pyle, CMA  November 30, 2009 9:23 AM   (pt picked up letter)

## 2010-09-29 NOTE — Progress Notes (Signed)
Summary: nos appt  Phone Note Call from Patient   Caller: [email or web address]  Call For: young Summary of Call: Rsc nos from 12/30 to 1/18. Initial call taken by: Darletta Moll,  August 30, 2010 11:22 AM

## 2010-09-29 NOTE — Progress Notes (Signed)
Summary: nos appt  Phone Note Call from Patient   Caller: [email or web address]  Call For: young Summary of Call: ATC pt to rsc nos from 1/18, no vm. Initial call taken by: Darletta Moll,  September 15, 2010 10:05 AM

## 2010-11-19 ENCOUNTER — Emergency Department (HOSPITAL_BASED_OUTPATIENT_CLINIC_OR_DEPARTMENT_OTHER)
Admission: EM | Admit: 2010-11-19 | Discharge: 2010-11-19 | Disposition: A | Discharge status: Home / Self Care | Payer: Self-pay | Attending: Emergency Medicine | Admitting: Emergency Medicine

## 2010-11-19 DIAGNOSIS — Z9109 Other allergy status, other than to drugs and biological substances: Secondary | ICD-10-CM | POA: Insufficient documentation

## 2010-11-19 DIAGNOSIS — J309 Allergic rhinitis, unspecified: Secondary | ICD-10-CM | POA: Insufficient documentation

## 2010-11-19 DIAGNOSIS — J329 Chronic sinusitis, unspecified: Secondary | ICD-10-CM | POA: Insufficient documentation

## 2010-11-19 DIAGNOSIS — E039 Hypothyroidism, unspecified: Secondary | ICD-10-CM | POA: Insufficient documentation

## 2010-11-21 ENCOUNTER — Telehealth: Payer: Self-pay | Admitting: Internal Medicine

## 2010-11-21 ENCOUNTER — Telehealth: Payer: Self-pay | Admitting: Pulmonary Disease

## 2010-11-21 NOTE — Telephone Encounter (Signed)
ATC pt-unable to leave a message.

## 2010-11-21 NOTE — Telephone Encounter (Signed)
Error- duplicate msg. Gwendolyn Scott  °

## 2010-11-22 NOTE — Telephone Encounter (Signed)
Pt called back (didn't know why she didn't get a msg on her cell- says it's working fine). [phone]. Gwendolyn Scott

## 2010-11-22 NOTE — Telephone Encounter (Signed)
Called and spoke with pt and she stated that she is having problems with allergies/sinus drainage that is worse in the evening, cough that is dry, hoarsness.  Pt is requesting recs for this please.  thanks

## 2010-11-22 NOTE — Telephone Encounter (Signed)
Per-CDY suggest OTC combo like tylenol allergy and sinus or fexofenadine 180mg  plus Delsym cough syrup. Julaine Hua, CMA   Pt informed of CDY recs. Julaine Hua, CMA

## 2010-12-13 ENCOUNTER — Ambulatory Visit (INDEPENDENT_AMBULATORY_CARE_PROVIDER_SITE_OTHER): Payer: Self-pay | Admitting: Adult Health

## 2010-12-13 ENCOUNTER — Encounter: Payer: Self-pay | Admitting: Adult Health

## 2010-12-13 ENCOUNTER — Ambulatory Visit: Payer: Self-pay | Admitting: Internal Medicine

## 2010-12-13 DIAGNOSIS — J069 Acute upper respiratory infection, unspecified: Secondary | ICD-10-CM

## 2010-12-13 LAB — HCG, SERUM, QUALITATIVE: Preg, Serum: NEGATIVE

## 2010-12-13 MED ORDER — AZITHROMYCIN 250 MG PO TABS: 250.0000 mg | ORAL_TABLET | Freq: Every day | ORAL | Status: AC

## 2010-12-13 NOTE — Progress Notes (Signed)
  Subjective:    Patient ID: Gwendolyn Scott, female    DOB: 1975-10-10, 36 y.o.   MRN: [id number]  HPI 35 yo AAF with known hx of Allergic Rhinitis   12/13/2010 Acute OV Pt complains of 3 days of nasal congestion, drainage, sore throat, cough, minimally productive.  Feels a lot of post nasal drainage. Has ear fullness and ache in ears at times. Especially after coughing.  No fever or chills. No otc used. No discolored mucus     Review of Systems Constitutional:   No  weight loss, night sweats,  Fevers, chills, fatigue, or  lassitude.  HEENT:   No headaches,  Difficulty swallowing,  Tooth/dental problems, +Sore throat,               + sneezing, itching, ear ache, nasal congestion, post nasal drip,   CV:  No chest pain,  Orthopnea, PND, swelling in lower extremities, anasarca, dizziness, palpitations, syncope.   GI  No heartburn, indigestion, abdominal pain, nausea, vomiting, diarrhea, change in bowel habits, loss of appetite, bloody stools.   Resp: No shortness of breath with exertion or at rest.  No excess mucus, no productive cough,  No non-productive cough,  No coughing up of blood.  No change in color of mucus.  No wheezing.  No chest wall deformity  Skin: no rash or lesions.  GU: no dysuria, change in color of urine, no urgency or frequency.  No flank pain, no hematuria   MS:  No joint pain or swelling.  No decreased range of motion.  No back pain.  Psych:  No change in mood or affect. No depression or anxiety.  No memory loss.       Objective:   Physical Exam GEN: A/Ox3; pleasant , NAD, well nourished   HEENT:  [place]/AT,  EACs-clear, TMs-wnl, NOSE-clear, THROAT-clear, no lesions, no postnasal drip or exudate noted.   NECK:  Supple w/ fair ROM; no JVD; normal carotid impulses w/o bruits; no thyromegaly or nodules palpated; no lymphadenopathy.  RESP  Clear  P & A; w/o, wheezes/ rales/ or rhonchi.no accessory muscle use, no dullness to percussion  CARD:  RRR, no m/r/g  ,  no peripheral edema, pulses intact, no cyanosis or clubbing.  GI:   Soft & nt; nml bowel sounds; no organomegaly or masses detected.  Musco: Warm bil, no deformities or joint swelling noted.   Neuro: alert, no focal deficits noted.    Skin: Warm, no lesions or rashes ,       Assessment & Plan:

## 2010-12-13 NOTE — Patient Instructions (Signed)
It appears you have an upper respiratory infection probable Viral in nature Recommend :  Mucinex DM Twice daily  Cough/congestion  Zyrtec or Claritin At bedtime  As needed  Drainage.  Fluids and rest.  Saline nasal rinses As needed   Zpack to have on hold if symptoms worsen with discolored mucus or do not improve after 7 days Please contact office for sooner follow up if symptoms do not improve or worsen or seek emergency care   follow up Dr. Maple Hudson  As planned and As needed

## 2010-12-14 ENCOUNTER — Encounter: Payer: Self-pay | Admitting: Adult Health

## 2010-12-14 NOTE — Assessment & Plan Note (Signed)
Suspect Viral URI:  Plan:   Mucinex DM Twice daily  Cough/congestion  Zyrtec or Claritin At bedtime  As needed  Drainage.  Fluids and rest.  Saline nasal rinses As needed   Zpack to have on hold if symptoms worsen with discolored mucus or do not improve after 7 days Please contact office for sooner follow up if symptoms do not improve or worsen or seek emergency care   follow up Dr. Maple Hudson  As planned and As needed

## 2011-01-10 NOTE — Assessment & Plan Note (Signed)
[facility]                                 ON-CALL NOTE   Gwendolyn Scott, Gwendolyn Scott             MRN:          [id number]  DATE:01/15/2009                            DOB:          18-Aug-1976    Time received is 6:54 p.m.Telephone [phone].  Her doctor is Dr.  Everardo All.  She called about a medication question.  Dr. Everardo All had sent a  prescription for lactulose syrup earlier in the day to [facility] at [phone]  [phone] for her to take 3 tablespoons t.i.d. as needed.  No quantity was  given.  I did speak to the pharmacist at [facility] and indicated a  quantity of 480 mL.  The prescription already had refills on it, so I  asked them to keep the use of the same.     Tera Mater. Clent Ridges, MD  Electronically Signed    SAF/MedQ  DD: 01/16/2009  DT: 01/17/2009  Job #: [id number]

## 2011-01-31 ENCOUNTER — Emergency Department (HOSPITAL_BASED_OUTPATIENT_CLINIC_OR_DEPARTMENT_OTHER)
Admission: EM | Admit: 2011-01-31 | Discharge: 2011-01-31 | Disposition: A | Discharge status: Home / Self Care | Payer: Self-pay | Attending: Emergency Medicine | Admitting: Emergency Medicine

## 2011-01-31 DIAGNOSIS — K029 Dental caries, unspecified: Secondary | ICD-10-CM | POA: Insufficient documentation

## 2011-04-28 ENCOUNTER — Other Ambulatory Visit: Payer: Self-pay | Admitting: Obstetrics and Gynecology

## 2011-06-05 ENCOUNTER — Telehealth: Payer: Self-pay | Admitting: Internal Medicine

## 2011-06-05 NOTE — Telephone Encounter (Signed)
Suggest mucinex D and can set up a routine f/u appointment

## 2011-06-05 NOTE — Telephone Encounter (Signed)
Pt is returning call & can be reached at [phone].  Pt requests if she is going to get an appt for tomorrow to leave the appt information on her voice mail.  Pt is at work & called back on her break.  Antionette Fairy

## 2011-06-05 NOTE — Telephone Encounter (Signed)
LMTCB

## 2011-06-05 NOTE — Telephone Encounter (Signed)
I spoke with pt and she c/o right ear feels stopped up and feels like their is a lump in her throat, sinus headache,sinus pressure x couple days. Pt denies any cough, wheezing, chest tightness, chest congestion, fever, nausea, vomiting. Pt is requesting Dr. Roxy Cedar recs. Please advise Thanks  Allergies  Allergen Reactions  . Penicillins Other (See Comments)    Unknown reaction    Carver Fila, CMA

## 2011-06-05 NOTE — Telephone Encounter (Signed)
lmomtcb  

## 2011-06-06 NOTE — Telephone Encounter (Signed)
PT RETURNED CALL. ASKS THAT NURSE CALL BACK TODAY BEFORE CLOSING- OK TO LMOM AS PT WAS ON HER BREAK (SHE'S AT WORK) [phone]. Gwendolyn Scott

## 2011-06-06 NOTE — Telephone Encounter (Signed)
lmomtcb  

## 2011-06-06 NOTE — Telephone Encounter (Signed)
I have spoke with patient- she is aware that OV was scheduled with CY when she spoke to [place] today. This phone message is completed.

## 2011-06-07 ENCOUNTER — Ambulatory Visit: Payer: Self-pay | Admitting: Internal Medicine

## 2011-07-07 ENCOUNTER — Ambulatory Visit (INDEPENDENT_AMBULATORY_CARE_PROVIDER_SITE_OTHER): Payer: BC Managed Care – PPO | Admitting: Endocrinology

## 2011-07-07 ENCOUNTER — Encounter: Payer: Self-pay | Admitting: Endocrinology

## 2011-07-07 ENCOUNTER — Other Ambulatory Visit (INDEPENDENT_AMBULATORY_CARE_PROVIDER_SITE_OTHER): Payer: BC Managed Care – PPO

## 2011-07-07 VITALS — BP 108/80 | HR 81 | Temp 98.4°F | Ht 61.0 in | Wt 245.4 lb

## 2011-07-07 DIAGNOSIS — E0590 Thyrotoxicosis, unspecified without thyrotoxic crisis or storm: Secondary | ICD-10-CM

## 2011-07-07 LAB — T4, FREE: Free T4: 0.84 ng/dL (ref 0.60–1.60)

## 2011-07-07 NOTE — Progress Notes (Signed)
  Subjective:    Patient ID: Gwendolyn Scott, female    DOB: 07-Sep-1975, 35 y.o.   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  She had persistent hyperthyroidism, so she was rx'ed with tapazole.  She saw dr in [place] 4 mos ago.  She says the dr stopped tapazole then.  Since then, he has a few mos of slight palpitations in the chest, and assoc diarrhea.  She takes depo-provera q 3 mos (dr Tenny Craw).   No past medical history on file.  No past surgical history on file.  History   Social History  . Marital Status: Married    Spouse Name: N/A    Number of Children: N/A  . Years of Education: N/A   Occupational History  . Not on file.   Social History Main Topics  . Smoking status: Never Smoker   . Smokeless tobacco: Never Used  . Alcohol Use: No  . Drug Use: No  . Sexually Active: Not on file   Other Topics Concern  . Not on file   Social History Narrative  . No narrative on file    No current outpatient prescriptions on file prior to visit.    Allergies  Allergen Reactions  . Penicillins Other (See Comments)    Unknown reaction    No family history on file.  BP 108/80  Pulse 81  Temp(Src) 98.4 F (36.9 C) (Oral)  Ht 5\' 1"  (1.549 m)  Wt 245 lb 6 oz (111.301 kg)  BMI 46.36 kg/m2  SpO2 99%  Review of Systems Denies fever    Objective:   Physical Exam VITAL SIGNS:  See vs page GENERAL: no distress.  Obese.   Neck: thyroid is slightly and diffusely enlarged.   Lab Results  Component Value Date   TSH 3.12 07/07/2011      Assessment & Plan:  Hyperthyroidism has not recurred yet.  However, it may recur with time.

## 2011-07-07 NOTE — Patient Instructions (Addendum)
blood tests are being requested for you today.  please call [phone] to hear your test results.  You will be prompted to enter the 9-digit "MRN" number that appears at the top left of this page, followed by #.  Then you will hear the message. If it it high again: let's check a thyroid "scan" (a special, but easy and painless type of thyroid x ray).  It works like this: you go to the x-ray department of the hospital to swallow a pill, which contains a miniscule amount of radiation.  You will not notice any symptoms from this.  You will go back to the x-ray department the next day, to lie down in front of a camera.  The results of this will be sent to me.  please call [phone] to hear your test results.  You will be prompted to enter the 9-digit "MRN" number that appears at the top left of this page, followed by #.  Then you will hear the message. Based on the results, i hope to order for you a treatment pill of radioactive iodine.  Although it is a larger amount of radiation, you will again notice no symptoms from this.  The pill is gone from your body in a few days (during which you should stay away from other people), but takes several months to work.  Therefore, please return here approximately 6-8 weeks after the treatment.  This treatment has been available for many years, and the only known side-effect is an underactive thyroid.  It is possible that i would eventually prescribe for you a thyroid hormone pill, which is very inexpensive.  You don't have to worry about side-effects of this thyroid hormone pill, because it is the same molecule your thyroid makes.   (update: i left message on phone-tree:  Blood test is normal.  No rx needed now.  Please come back for a follow-up appointment in 3 months)

## 2011-07-11 ENCOUNTER — Ambulatory Visit: Payer: Self-pay | Admitting: Endocrinology

## 2011-07-11 ENCOUNTER — Ambulatory Visit (INDEPENDENT_AMBULATORY_CARE_PROVIDER_SITE_OTHER): Payer: BC Managed Care – PPO | Admitting: Internal Medicine

## 2011-07-11 ENCOUNTER — Encounter: Payer: Self-pay | Admitting: Internal Medicine

## 2011-07-11 DIAGNOSIS — R002 Palpitations: Secondary | ICD-10-CM

## 2011-07-11 DIAGNOSIS — E0590 Thyrotoxicosis, unspecified without thyrotoxic crisis or storm: Secondary | ICD-10-CM

## 2011-07-11 NOTE — Progress Notes (Signed)
  Subjective:    Patient ID: Gwendolyn Scott, female    DOB: [birth year], 35 y.o.   MRN: [id number]  HPI  35 year old patient who has a history of hyperthyroidism and is status post I-131 treatment approximately 2 years ago at that time she was bothered by palpitations. She has had recent thyroid function studies that have been normal. For the past 2 weeks she has had recurrent palpitations these are described as rapid irregular heart rate with abrupt onset and slow resolution. They last 2 or 3 minutes but occur approximately 10 times per day symptoms have been bothersome for the past 2 weeks. She's had some mild reflux symptoms that do not seem to be related to the palpitations that have been well controlled with TUMS. She has moderated her caffeinated beverage use but still consumes tea; no nicotine.    Review of Systems  Constitutional: Negative.   HENT: Negative for hearing loss, congestion, sore throat, rhinorrhea, dental problem, sinus pressure and tinnitus.   Eyes: Negative for pain, discharge and visual disturbance.  Respiratory: Negative for cough and shortness of breath.   Cardiovascular: Positive for palpitations. Negative for chest pain and leg swelling.  Gastrointestinal: Negative for nausea, vomiting, abdominal pain, diarrhea, constipation, blood in stool and abdominal distention.  Genitourinary: Negative for dysuria, urgency, frequency, hematuria, flank pain, vaginal bleeding, vaginal discharge, difficulty urinating, vaginal pain and pelvic pain.  Musculoskeletal: Negative for joint swelling, arthralgias and gait problem.  Skin: Negative for rash.  Neurological: Negative for dizziness, syncope, speech difficulty, weakness, numbness and headaches.  Hematological: Negative for adenopathy.  Psychiatric/Behavioral: Negative for behavioral problems, dysphoric mood and agitation. The patient is not nervous/anxious.        Objective:   Physical Exam  Constitutional: She appears  well-developed and well-nourished. No distress.  Cardiovascular: Normal rate, regular rhythm and normal heart sounds.   Pulmonary/Chest: Effort normal and breath sounds normal. No respiratory distress. She has no wheezes. She has no rales. She exhibits no tenderness.          Assessment & Plan:   Palpitations.  The patient will eliminate all caffeine use  and chocolate use. Palpitations are short-lived and not too problematic. We'll clinically observe at this time. She will monitor her palpitations and note more carefully at the rate and rhythm. She will return in 2 weeks for followup or sooner if symptoms worsen Status post I-131 for Graves' disease. Euthyroid

## 2011-07-11 NOTE — Patient Instructions (Signed)
Aoid all caffeine and chocolate use  Monitor palpitations closely and count the rate (how fast over 1 minute)  and rhythm (regular or irregular?)  Return in 2 weeks if unimproved or sooner if symptoms worsen

## 2011-08-14 ENCOUNTER — Encounter: Payer: Self-pay | Admitting: Internal Medicine

## 2011-08-14 ENCOUNTER — Ambulatory Visit (INDEPENDENT_AMBULATORY_CARE_PROVIDER_SITE_OTHER): Payer: BC Managed Care – PPO | Admitting: Internal Medicine

## 2011-08-14 VITALS — BP 114/78 | HR 77 | Temp 98.4°F | Wt 247.0 lb

## 2011-08-14 DIAGNOSIS — R002 Palpitations: Secondary | ICD-10-CM | POA: Insufficient documentation

## 2011-08-14 NOTE — Progress Notes (Signed)
  Subjective:    Patient ID: Gwendolyn Scott, female    DOB: Dec 28, 1975, 35 y.o.   MRN: [id number]  HPI Palpitations---continue. Associated with a sensation of needing to take a deep breath  Past Medical History  Diagnosis Date  . Grave's disease 2009    I-131  . Grave's disease 2009    I-131    History   Social History  . Marital Status: Married    Spouse Name: N/A    Number of Children: N/A  . Years of Education: N/A   Occupational History  . Not on file.   Social History Main Topics  . Smoking status: Never Smoker   . Smokeless tobacco: Never Used  . Alcohol Use: No  . Drug Use: No  . Sexually Active: Not on file   Other Topics Concern  . Not on file   Social History Narrative  . No narrative on file    History reviewed. No pertinent past surgical history.  Family History  Problem Relation Age of Onset  . Hypothyroidism Mother     Allergies  Allergen Reactions  . Penicillins Other (See Comments)    Unknown reaction    Current Outpatient Prescriptions on File Prior to Visit  Medication Sig Dispense Refill  . acetaminophen (TYLENOL) 500 MG tablet Take 500 mg by mouth every 6 (six) hours as needed.        . calcium carbonate (TUMS - DOSED IN MG ELEMENTAL CALCIUM) 500 MG chewable tablet Chew 1 tablet by mouth 2 (two) times daily.               Review of Systems patient denies chest pain, shortness of breath, orthopnea. Denies lower extremity edema, abdominal pain, change in appetite, change in bowel movements. Patient denies rashes, musculoskeletal complaints. No other specific complaints in a complete review of systems.      Objective:   Physical Exam BP 114/78  Pulse 77  Temp(Src) 98.4 F (36.9 C) (Oral)  Wt 247 lb (112.038 kg)  SpO2 95%  Well-developed well-nourished female in no acute distress. HEENT exam atraumatic, normocephalic, extraocular muscles are intact. Neck is supple. No jugular venous distention no thyromegaly. Chest clear to  auscultation without increased work of breathing. Cardiac exam S1 and S2 are regular. Abdominal exam active bowel sounds, soft, nontender. Extremities no edema.       Assessment & Plan:

## 2011-08-14 NOTE — Assessment & Plan Note (Signed)
Chronic problem--needs further evaluation If echo normal I will not further evaluate

## 2011-08-21 ENCOUNTER — Ambulatory Visit (HOSPITAL_COMMUNITY): Payer: BC Managed Care – PPO | Attending: Cardiology | Admitting: Radiology

## 2011-08-21 DIAGNOSIS — I059 Rheumatic mitral valve disease, unspecified: Secondary | ICD-10-CM | POA: Insufficient documentation

## 2011-08-21 DIAGNOSIS — E669 Obesity, unspecified: Secondary | ICD-10-CM | POA: Insufficient documentation

## 2011-08-21 DIAGNOSIS — E0590 Thyrotoxicosis, unspecified without thyrotoxic crisis or storm: Secondary | ICD-10-CM | POA: Insufficient documentation

## 2011-08-21 DIAGNOSIS — R002 Palpitations: Secondary | ICD-10-CM

## 2011-09-05 ENCOUNTER — Ambulatory Visit (INDEPENDENT_AMBULATORY_CARE_PROVIDER_SITE_OTHER): Payer: BC Managed Care – PPO | Admitting: Internal Medicine

## 2011-09-05 ENCOUNTER — Encounter: Payer: Self-pay | Admitting: Internal Medicine

## 2011-09-05 VITALS — BP 122/90 | Temp 98.1°F | Ht 61.0 in | Wt 247.0 lb

## 2011-09-05 DIAGNOSIS — J069 Acute upper respiratory infection, unspecified: Secondary | ICD-10-CM

## 2011-09-05 DIAGNOSIS — K219 Gastro-esophageal reflux disease without esophagitis: Secondary | ICD-10-CM

## 2011-09-05 MED ORDER — OMEPRAZOLE 20 MG PO CPDR: 20.0000 mg | DELAYED_RELEASE_CAPSULE | Freq: Every day | ORAL | Status: DC

## 2011-09-05 NOTE — Progress Notes (Signed)
Patient ID: Gwendolyn Scott, female   DOB: 20-Sep-1975, 36 y.o.   MRN: [id number] 2 issues: URI sxs for 3 days: rhinorrhea, ear pressure. No fever.  Multiple family members with similar sxs  GERD--really ongoing for months. Worse the past 2 months. Tums without significant relief. No known GI blood loss  Past Medical History  Diagnosis Date  . Grave's disease 2009    I-131  . Grave's disease 2009    I-131    History   Social History  . Marital Status: Married    Spouse Name: N/A    Number of Children: N/A  . Years of Education: N/A   Occupational History  . Not on file.   Social History Main Topics  . Smoking status: Never Smoker   . Smokeless tobacco: Never Used  . Alcohol Use: No  . Drug Use: No  . Sexually Active: Not on file   Other Topics Concern  . Not on file   Social History Narrative  . No narrative on file    No past surgical history on file.  Family History  Problem Relation Age of Onset  . Hypothyroidism Mother     Allergies  Allergen Reactions  . Penicillins Other (See Comments)    Unknown reaction    Current Outpatient Prescriptions on File Prior to Visit  Medication Sig Dispense Refill  . acetaminophen (TYLENOL) 500 MG tablet Take 500 mg by mouth every 6 (six) hours as needed.        . calcium carbonate (TUMS - DOSED IN MG ELEMENTAL CALCIUM) 500 MG chewable tablet Chew 1 tablet by mouth 2 (two) times daily.           patient denies chest pain, shortness of breath, orthopnea. Denies lower extremity edema, abdominal pain, change in appetite, change in bowel movements. Patient denies rashes, musculoskeletal complaints. No other specific complaints in a complete review of systems.   BP 122/90  Temp(Src) 98.1 F (36.7 C) (Oral)  Ht 5\' 1"  (1.549 m)  Wt 247 lb (112.038 kg)  BMI 46.67 kg/m2  Well-developed well-nourished female in no acute distress. HEENT exam atraumatic, normocephalic, extraocular muscles are intact. Neck is supple. No jugular  venous distention no thyromegaly. Chest clear to auscultation without increased work of breathing. Cardiac exam S1 and S2 are regular. Abdominal exam active bowel sounds, soft, nontender.  A/P - uri sxs will self resolve

## 2011-09-05 NOTE — Assessment & Plan Note (Signed)
Trial PPI for 6 weeks Call if sxs persist/recur

## 2011-09-06 ENCOUNTER — Telehealth: Payer: Self-pay | Admitting: Internal Medicine

## 2011-09-06 NOTE — Telephone Encounter (Signed)
Pt called and is req to get a complete blood panel ordered.

## 2011-09-14 NOTE — Telephone Encounter (Signed)
appt scheduled

## 2011-09-19 ENCOUNTER — Other Ambulatory Visit (INDEPENDENT_AMBULATORY_CARE_PROVIDER_SITE_OTHER): Payer: BC Managed Care – PPO

## 2011-09-19 DIAGNOSIS — Z0000 Encounter for general adult medical examination without abnormal findings: Secondary | ICD-10-CM

## 2011-09-19 LAB — BASIC METABOLIC PANEL
Calcium: 9.1 mg/dL (ref 8.4–10.5)
GFR: 118.84 mL/min (ref >60.00)
Glucose, Bld: 91 mg/dL (ref 70–99)
Sodium: 142 mEq/L (ref 135–145)

## 2011-09-19 LAB — HEPATIC FUNCTION PANEL
AST: 13 U/L (ref 0–37)
Albumin: 3.6 g/dL (ref 3.5–5.2)
Alkaline Phosphatase: 54 U/L (ref 39–117)
Total Bilirubin: 0.8 mg/dL (ref 0.3–1.2)

## 2011-09-19 LAB — POCT URINALYSIS DIPSTICK
Leukocytes, UA: NEGATIVE
Nitrite, UA: NEGATIVE
Protein, UA: NEGATIVE
Urobilinogen, UA: 0.2

## 2011-09-19 LAB — CBC WITH DIFFERENTIAL/PLATELET
Basophils Absolute: 0 10*3/uL (ref 0.0–0.1)
Hemoglobin: 12.1 g/dL (ref 12.0–15.0)
Lymphocytes Relative: 30 % (ref 12.0–46.0)
Monocytes Relative: 6 % (ref 3.0–12.0)
Neutro Abs: 5.6 10*3/uL (ref 1.4–7.7)
RDW: 17 % — ABNORMAL HIGH (ref 11.5–14.6)

## 2011-09-19 LAB — LIPID PANEL
LDL Cholesterol: 77 mg/dL (ref 0–99)
Total CHOL/HDL Ratio: 4
VLDL: 8.6 mg/dL (ref 0.0–40.0)

## 2011-09-19 LAB — TSH: TSH: 3.59 u[IU]/mL (ref 0.35–5.50)

## 2011-09-26 ENCOUNTER — Encounter: Payer: Self-pay | Admitting: Endocrinology

## 2011-09-26 ENCOUNTER — Ambulatory Visit (INDEPENDENT_AMBULATORY_CARE_PROVIDER_SITE_OTHER): Payer: BC Managed Care – PPO | Admitting: Endocrinology

## 2011-09-26 DIAGNOSIS — E0590 Thyrotoxicosis, unspecified without thyrotoxic crisis or storm: Secondary | ICD-10-CM

## 2011-09-26 NOTE — Progress Notes (Signed)
  Subjective:    Patient ID: Gwendolyn Scott, female    DOB: October 23, 1975, 36 y.o.   MRN: [id number]  HPI Pt had i-131 rx in 2010, for hyperthyroidism, due to grave's dz.  She had persistent hyperthyroidism, so she was rx'ed with tapazole.  However, she has been off this med x 6 months.  Since then, pt states she feels well in general. She takes depo-provera q 3 mos (dr Tenny Craw).   Past Medical History  Diagnosis Date  . Grave's disease 2009    I-131    No past surgical history on file.  History   Social History  . Marital Status: Divorced    Spouse Name: N/A    Number of Children: 0  . Years of Education: N/A   Occupational History  . customer service    Social History Main Topics  . Smoking status: Never Smoker   . Smokeless tobacco: Never Used  . Alcohol Use: No  . Drug Use: No  . Sexually Active: Not on file   Other Topics Concern  . Not on file   Social History Narrative  . No narrative on file    Current Outpatient Prescriptions on File Prior to Visit  Medication Sig Dispense Refill  . acetaminophen (TYLENOL) 500 MG tablet Take 500 mg by mouth every 6 (six) hours as needed.        . medroxyPROGESTERone (DEPO-PROVERA) 150 MG/ML injection Inject 150 mg as directed Every 3 months.      [name] omeprazole (PRILOSEC) 20 MG capsule Take 1 capsule (20 mg total) by mouth daily.  30 capsule  1    Allergies  Allergen Reactions  . Penicillins Other (See Comments)    Unknown reaction    Family History  Problem Relation Age of Onset  . Hypothyroidism Mother   . Hypothyroidism Maternal Grandmother   . Diabetes Maternal Aunt   . Colon cancer Neg Hx     BP 110/70  Pulse 83  Temp(Src) 99 F (37.2 C) (Oral)  Ht 5\' 1"  (1.549 m)  Wt 243 lb (110.224 kg)  BMI 45.91 kg/m2  SpO2 97%   Review of Systems She has lost a few lbs, due to her efforts.      Objective:   Physical Exam VITAL SIGNS:  See vs page GENERAL: no distress NECK: There is no palpable thyroid enlargement.   No thyroid nodule is palpable.  No palpable lymphadenopathy at the anterior neck.   Lab Results  Component Value Date   TSH 3.59 09/19/2011      Assessment & Plan:  Hyperthyroidism has not yet recurred off the tapazole

## 2011-09-26 NOTE — Patient Instructions (Signed)
Please stay-off the methimazole for now. Please come back for a follow-up appointment in 4 months.   With time the overactive thyroid will almost always come back.

## 2011-09-27 ENCOUNTER — Encounter: Payer: Self-pay | Admitting: Internal Medicine

## 2011-09-28 ENCOUNTER — Encounter: Payer: Self-pay | Admitting: Internal Medicine

## 2011-09-28 ENCOUNTER — Ambulatory Visit (INDEPENDENT_AMBULATORY_CARE_PROVIDER_SITE_OTHER): Payer: BC Managed Care – PPO | Admitting: Internal Medicine

## 2011-09-28 DIAGNOSIS — K3189 Other diseases of stomach and duodenum: Secondary | ICD-10-CM

## 2011-09-28 DIAGNOSIS — R1013 Epigastric pain: Secondary | ICD-10-CM

## 2011-09-28 DIAGNOSIS — K219 Gastro-esophageal reflux disease without esophagitis: Secondary | ICD-10-CM

## 2011-09-28 MED ORDER — SIMETHICONE 125 MG PO CAPS: 1.0000 | ORAL_CAPSULE | Freq: Every day | ORAL | Status: DC

## 2011-09-28 MED ORDER — PANTOPRAZOLE SODIUM 40 MG PO TBEC: 40.0000 mg | DELAYED_RELEASE_TABLET | Freq: Every day | ORAL | Status: DC

## 2011-09-28 NOTE — Progress Notes (Signed)
Subjective:    Patient ID: Gwendolyn Scott, female    DOB: 1976-05-21, 36 y.o.   MRN: [id number]  HPI 36 yo female with PMH of Graves' disease status post ablation who seen in consultation at the request of Dr. Cato Mulligan for evaluation of abdominal bloating and GERD. The patient reports 4-6 weeks of abdominal bloating with frequent belching. Her belching is worse after eating, but can occur on an empty stomach.  She reports this interferes with her work.  She also feels an uncomfortable and annoying sensation in her chest, but she denies true heartburn. She was given a prescription for Prilosec 20 mg daily, and she has not felt any benefit. She reports her appetite is okay. No unintentional weight loss. No nausea or vomiting. She does endorse feeling full rather quickly, and admits this is a relatively new symptom. No dysphagia or odynophagia. She does endorse palpitations, and reports this was evaluated by her PCP. She states she had a normal EKG and echocardiogram. No dyspnea or dyspnea on exertion. No chest pain with exertion.  No f/c/ns. Bowel movements are normal without melena or bright red blood per rectum.  Review of Systems Constitutional: Negative for fever, chills, night sweats, activity change, appetite change and unexpected weight change HEENT: Negative for sore throat, mouth sores and trouble swallowing. Eyes: Negative for visual disturbance Respiratory: Negative for cough, chest tightness and shortness of breath Cardiovascular: Negative for chest pain, palpitations and lower extremity swelling Gastrointestinal: See history of present illness Genitourinary: Negative for dysuria and hematuria. Musculoskeletal: Negative for back pain, arthralgias and myalgias Skin: Negative for rash or color change Neurological: Negative for headaches, weakness, numbness Hematological: Negative for adenopathy, negative for easy bruising/bleeding Psychiatric/behavioral: Negative for depressed mood,  negative for anxiety  Past Medical History  Diagnosis Date  . Grave's disease 2009    I-131   Current Outpatient Prescriptions  Medication Sig Dispense Refill  . acetaminophen (TYLENOL) 500 MG tablet Take 500 mg by mouth every 6 (six) hours as needed.        . medroxyPROGESTERone (DEPO-PROVERA) 150 MG/ML injection Inject 150 mg as directed Every 3 months.      [name] omeprazole (PRILOSEC) 20 MG capsule Take 1 capsule (20 mg total) by mouth daily.  30 capsule  1   Allergies  Allergen Reactions  . Penicillins Other (See Comments)    Unknown reaction   Family History  Problem Relation Age of Onset  . Hypothyroidism Mother   . Hypothyroidism Maternal Grandmother   . Diabetes Maternal Aunt   . Colon cancer Neg Hx    Social History  . Marital Status: Divorced   Occupational History  . customer service    Social History Main Topics  . Smoking status: Never Smoker   . Smokeless tobacco: Never Used  . Alcohol Use: No  . Drug Use: No      Objective:   Physical Exam BP 100/66  Pulse 78  Ht 5\' 1"  (1.549 m)  Wt 243 lb 9.6 oz (110.496 kg)  BMI 46.03 kg/m2 Constitutional: Well-developed and well-nourished. No distress. HEENT: Normocephalic and atraumatic. Oropharynx is clear and moist. No oropharyngeal exudate. Conjunctivae are normal. Pupils are equal round and reactive to light. No scleral icterus. Neck: Neck supple. Trachea midline. Cardiovascular: Normal rate, regular rhythm and intact distal pulses. No M/R/G Pulmonary/chest: Effort normal and breath sounds normal. No wheezing, rales or rhonchi. Abdominal: Soft, mild epigastric TTP without rebound or guarding. nondistended. Bowel sounds active throughout. There are no masses palpable.  No hepatosplenomegaly. Extremities: no clubbing, cyanosis, or edema Lymphadenopathy: No cervical adenopathy noted. Neurological: Alert and oriented to person place and time. Skin: Skin is warm and dry. No rashes noted. Psychiatric: Normal mood and  affect. Behavior is normal.  CBC    Component Value Date/Time   WBC 8.9 09/19/2011 0855   RBC 4.45 09/19/2011 0855   HGB 12.1 09/19/2011 0855   HCT 37.1 09/19/2011 0855   PLT 314.0 09/19/2011 0855   MCV 83.5 09/19/2011 0855   MCHC 32.7 09/19/2011 0855   RDW 17.0* 09/19/2011 0855   LYMPHSABS 2.7 09/19/2011 0855   MONOABS 0.5 09/19/2011 0855   EOSABS 0.1 09/19/2011 0855   BASOSABS 0.0 09/19/2011 0855    CMP     Component Value Date/Time   NA 142 09/19/2011 0855   K 4.2 09/19/2011 0855   CL 109 09/19/2011 0855   CO2 25 09/19/2011 0855   GLUCOSE 91 09/19/2011 0855   BUN 6 09/19/2011 0855   CREATININE 0.7 09/19/2011 0855   CALCIUM 9.1 09/19/2011 0855   PROT 7.5 09/19/2011 0855   ALBUMIN 3.6 09/19/2011 0855   AST 13 09/19/2011 0855   ALT 11 09/19/2011 0855   ALKPHOS 54 09/19/2011 0855   BILITOT 0.8 09/19/2011 0855   GFRNONAA 122.02 11/29/2009 0844    TSH - recently normal    Assessment & Plan:  36 yo female with PMH of Graves' disease status post ablation who seen in consultation at the request of Dr. Cato Mulligan for evaluation of abdominal bloating and GERD.   1. Belching /GERD -- the patient's upper GI symptoms, including gas and belching, could be acid peptic related. She has not responded to 20 mg of omeprazole, and therefore I recommended upper endoscopy for further evaluation. We discussed this test including the risks and benefits and she is agreeable to proceed. I will plan biopsy to rule out H. pylori. The differential includes acid peptic disease, ulcer, gastritis, gastroparesis.  We discussed gas and bloating diet and she is given a handout today. I also will switch her to a more potent PPI, specifically pantoprazole 40 mg daily. We discussed how best to take this, specifically 30 minutes to one hour before her first meal of the day.  Further recommendations can be as needed after endoscopy

## 2011-09-28 NOTE — Patient Instructions (Addendum)
You have been scheduled for an endoscopy. Please follow written instructions given to you at your visit today.  We have sent the following medications to your pharmacy for you to pick up at your convenience: Protonix; take 1 tablet 30-60 min. Before breakfast.  simethicone; take as directed

## 2011-10-02 ENCOUNTER — Other Ambulatory Visit: Payer: Self-pay | Admitting: Gastroenterology

## 2011-10-02 ENCOUNTER — Telehealth: Payer: Self-pay | Admitting: Internal Medicine

## 2011-10-03 ENCOUNTER — Other Ambulatory Visit: Payer: Self-pay | Admitting: Gastroenterology

## 2011-10-03 DIAGNOSIS — K219 Gastro-esophageal reflux disease without esophagitis: Secondary | ICD-10-CM

## 2011-10-03 MED ORDER — OMEPRAZOLE 20 MG PO CPDR: 40.0000 mg | DELAYED_RELEASE_CAPSULE | Freq: Every day | ORAL | Status: DC

## 2011-10-23 ENCOUNTER — Telehealth: Payer: Self-pay | Admitting: Internal Medicine

## 2011-10-23 NOTE — Telephone Encounter (Signed)
No charge. 

## 2011-10-25 ENCOUNTER — Encounter: Payer: BC Managed Care – PPO | Admitting: Internal Medicine

## 2011-10-26 ENCOUNTER — Ambulatory Visit (INDEPENDENT_AMBULATORY_CARE_PROVIDER_SITE_OTHER): Payer: BC Managed Care – PPO | Admitting: Family

## 2011-10-26 ENCOUNTER — Encounter: Payer: Self-pay | Admitting: Family

## 2011-10-26 DIAGNOSIS — J019 Acute sinusitis, unspecified: Secondary | ICD-10-CM

## 2011-10-26 DIAGNOSIS — R05 Cough: Secondary | ICD-10-CM

## 2011-10-26 MED ORDER — AZITHROMYCIN 250 MG PO TABS: ORAL_TABLET | ORAL | Status: AC

## 2011-10-26 MED ORDER — GUAIFENESIN-CODEINE 100-10 MG/5ML PO SYRP: 5.0000 mL | ORAL_SOLUTION | Freq: Three times a day (TID) | ORAL | Status: AC | PRN

## 2011-10-26 NOTE — Progress Notes (Signed)
  Subjective:    Patient ID: Gwendolyn Scott, female    DOB: Apr 16, 1976, 36 y.o.   MRN: [id number]  HPI Comments: C/o congestion, productive cough with yellow mucus, fever/chills, intermittent pressure headaches, bilat ear pressure, and sneezing started Mon. Works at nursing home.   Sinusitis Associated symptoms include congestion, ear pain, sinus pressure, sneezing and a sore throat.  Otalgia  Associated symptoms include a sore throat.      Review of Systems  Constitutional: Positive for fever and fatigue.  HENT: Positive for ear pain, congestion, sore throat, sneezing, postnasal drip and sinus pressure. Negative for nosebleeds, voice change and tinnitus.   Eyes: Negative for pain, discharge, redness and visual disturbance.  Respiratory: Negative.   Cardiovascular: Negative.        Objective:   Physical Exam  Constitutional: She is oriented to person, place, and time. She appears well-developed and well-nourished.  HENT:  Right Ear: External ear normal.  Left Ear: External ear normal.  Nose: Nose normal.  Mouth/Throat: Oropharyngeal exudate present.  Eyes: Conjunctivae are normal. Right eye exhibits no discharge. Left eye exhibits no discharge.  Cardiovascular: Normal rate, regular rhythm, normal heart sounds and intact distal pulses.  Exam reveals no gallop and no friction rub.   No murmur heard. Pulmonary/Chest: Effort normal and breath sounds normal. No respiratory distress. She has no wheezes. She has no rales. She exhibits no tenderness.  Neurological: She is alert and oriented to person, place, and time.  Skin: Skin is warm and dry. She is not diaphoretic.          Assessment & Plan:  Assessment: Acute sinusitis, cough  Plan: increase po fluids and rest. RTC if s/s do not resolve in one week or get worse. Azithromycin, robitussin. Teaching handouts provided on medications and diagnosis

## 2011-10-26 NOTE — Patient Instructions (Signed)

## 2011-10-30 ENCOUNTER — Telehealth: Payer: Self-pay

## 2011-10-30 NOTE — Telephone Encounter (Signed)
LMOM for pt TCB to get more details on her sx. (pt hasn't seen CY since 05/2010)

## 2011-10-31 ENCOUNTER — Encounter: Payer: Self-pay | Admitting: Family

## 2011-10-31 ENCOUNTER — Ambulatory Visit (INDEPENDENT_AMBULATORY_CARE_PROVIDER_SITE_OTHER): Payer: BC Managed Care – PPO | Admitting: Family

## 2011-10-31 ENCOUNTER — Ambulatory Visit (INDEPENDENT_AMBULATORY_CARE_PROVIDER_SITE_OTHER)
Admission: RE | Admit: 2011-10-31 | Discharge: 2011-10-31 | Disposition: A | Discharge status: Home / Self Care | Payer: BC Managed Care – PPO | Source: Ambulatory Visit | Attending: Family | Admitting: Family

## 2011-10-31 ENCOUNTER — Telehealth: Payer: Self-pay | Admitting: Internal Medicine

## 2011-10-31 DIAGNOSIS — C3490 Malignant neoplasm of unspecified part of unspecified bronchus or lung: Secondary | ICD-10-CM

## 2011-10-31 DIAGNOSIS — J209 Acute bronchitis, unspecified: Secondary | ICD-10-CM

## 2011-10-31 DIAGNOSIS — R05 Cough: Secondary | ICD-10-CM

## 2011-10-31 DIAGNOSIS — R059 Cough, unspecified: Secondary | ICD-10-CM

## 2011-10-31 MED ORDER — PREDNISONE 20 MG PO TABS: ORAL_TABLET | ORAL | Status: DC

## 2011-10-31 NOTE — Telephone Encounter (Signed)
Called, spoke with pt.  I informed her CDY recs she take mucinex D.  She verbalized understanding of this and voiced no further questions/cocnerns at this time.  Last seen by Dr. Maple Hudson 05/2010 -- pt did want to schedule f/u with CDY.  This was scheduled for March 7 at 9 am -- pt aware.

## 2011-10-31 NOTE — Telephone Encounter (Signed)
Recommend mucinex-D at pharmacy counter

## 2011-10-31 NOTE — Telephone Encounter (Signed)
Pt came in to see Adline Mango on 10/31/11 and forgot to get a doctors note to return to work. Pt just got a chest xray done at [facility]. Pt is due to return back to work on Friday 11/03/11. Pt would like to pick up doctors note this wk. Pls call pt when ready for pick up.

## 2011-10-31 NOTE — Telephone Encounter (Signed)
Note printed and pt aware ready for pick up

## 2011-10-31 NOTE — Progress Notes (Signed)
  Subjective:    Patient ID: Gwendolyn Scott, female    DOB: 1976/03/15, 36 y.o.   MRN: [id number]  HPI Comments: 36 year old [place] female, nonsmoker returns today after being diagnosed with a sinusitis about a week ago. She took a Z-Pak and with the Tussionex that has not helped her symptoms. Her congestion appears to have spread to her lungs at this point. She reports fevers around 101 particularly at night. Continues to have muscle aches and pain. She denies any lightheadedness, dizziness, chest pain or palpitations  Cough Associated symptoms include a fever, postnasal drip and rhinorrhea.      Review of Systems  Constitutional: Positive for fever and fatigue.  HENT: Positive for congestion, rhinorrhea, sneezing and postnasal drip.   Respiratory: Positive for cough.   Gastrointestinal: Negative.   Musculoskeletal: Negative.   Skin: Negative.   Neurological: Negative.   Hematological: Negative.   Psychiatric/Behavioral: Negative.        Objective:   Physical Exam  Constitutional: She is oriented to person, place, and time. She appears well-developed and well-nourished.  HENT:  Right Ear: External ear normal.  Left Ear: External ear normal.  Nose: Nose normal.  Mouth/Throat: Oropharynx is clear and moist.  Neck: Normal range of motion. Neck supple.  Cardiovascular: Normal rate, regular rhythm and normal heart sounds.   Pulmonary/Chest: Effort normal and breath sounds normal.  Musculoskeletal: Normal range of motion.  Neurological: She is alert and oriented to person, place, and time.  Skin: Skin is warm and dry.  Psychiatric: She has a normal mood and affect.          Assessment & Plan:  Assessment: Cough, Bronchitis,   Plan: Prednisone. CXR. Will follow up with patient pending the results of CXR. Call the office if symptoms worsen or persist. Recheck pending xray and prn.

## 2011-10-31 NOTE — Telephone Encounter (Signed)
Pt finished Zpak yesterday but still has a lot of sinus & head congestion.  Drainage is clear since taking the zpak. C/o headaches.  Denies fever.  Pt wants something to help relieve congestion.  PT is not taking any otc meds or nasal sprays at this time.  Please advise.

## 2011-10-31 NOTE — Telephone Encounter (Signed)
Pt still hasn't heard anything. Wants to talk to nurse Henderson Cloud

## 2011-11-01 NOTE — Telephone Encounter (Signed)
Made in error

## 2011-11-02 ENCOUNTER — Ambulatory Visit (INDEPENDENT_AMBULATORY_CARE_PROVIDER_SITE_OTHER): Payer: BC Managed Care – PPO | Admitting: Internal Medicine

## 2011-11-02 ENCOUNTER — Encounter: Payer: Self-pay | Admitting: Internal Medicine

## 2011-11-02 ENCOUNTER — Telehealth: Payer: Self-pay

## 2011-11-02 VITALS — BP 112/68 | HR 72 | Ht 61.0 in | Wt 243.2 lb

## 2011-11-02 DIAGNOSIS — J069 Acute upper respiratory infection, unspecified: Secondary | ICD-10-CM

## 2011-11-02 MED ORDER — CLARITHROMYCIN 500 MG PO TABS: ORAL_TABLET | ORAL | Status: AC

## 2011-11-02 NOTE — Telephone Encounter (Signed)
Pt had a question about medication directions. Prednisone dosage clarified for pt  Pt would like return to work note to be for Monday, November 06, 2011 instead of her requested date of Friday, November 03, 2011. Pt would need a new note printed and will pick it up Monday

## 2011-11-02 NOTE — Progress Notes (Signed)
Patient ID: Jozlin Bently, female    DOB: October 18, 1975, 36 y.o.   MRN: [id number]  HPI 36 yo AAF with known hx of Allergic Rhinitis   12/13/2010 Acute OV Pt complains of 3 days of nasal congestion, drainage, sore throat, cough, minimally productive.  Feels a lot of post nasal drainage. Has ear fullness and ache in ears at times. Especially after coughing.  No fever or chills. No otc used. No discolored mucus   11/02/11- 35 yoF never smoker followed for allergic rhinitis complicated by hx of lung ca/ adenoca, GERD, hyperthyroid FOLLOWS FOR : PT States having sinus drainage,finished zpak Monday stated prednisone tue.(NP Orvan Falconer) states fever on monday, productive cough yellowish green and thick.  Had flu vaccine. For past 2 weeks has been acutely ill. Watery nasal discharge, sneezing, aching all over, ear ache, fever. Primary physician office treated Z-Pak finished 4 days ago. Not any better. Reports temperature 100.1. Returned with prednisone taper started 2 days ago. Some green mucus with cough but otherwise no chest tightness wheeze or shortness of breath. No GI upset, no pain currently.  ROS-see HPI Constitutional:   No-   weight loss, night sweats, +fevers, chills, fatigue, lassitude. HEENT:   No-  headaches, difficulty swallowing, tooth/dental problems, sore throat,       No-  sneezing, itching, ear ache,  +nasal congestion, post nasal drip,  CV:  No-   chest pain, orthopnea, PND, swelling in lower extremities, anasarca, dizziness, palpitations Resp: No-   shortness of breath with exertion or at rest.              +   productive cough,  No non-productive cough,  No- coughing up of blood.              +   change in color of mucus.  No- wheezing.   Skin: No-   rash or lesions. GI:  No-   heartburn, indigestion, abdominal pain, nausea, vomiting,  GU:  MS:  No-   joint pain or swelling. . Neuro-     nothing unusual Psych:  No- change in mood or affect. No depression or anxiety.  No memory  loss.       OBJ- Physical Exam General- Alert, Oriented, Affect-appropriate, Distress- none acute Skin- rash-none, lesions- none, excoriation- none Lymphadenopathy- none Head- atraumatic            Eyes- Gross vision intact, PERRLA, conjunctivae and secretions clear            Ears- Hearing, canals-normal            Nose- turbinate edema, no-Septal dev, mucus, polyps, erosion, perforation             Throat- Mallampati III , mucosa clear , drainage- none, tonsils- atrophic Neck- flexible , trachea midline, no stridor , thyroid nl, carotid no bruit Chest - symmetrical excursion , unlabored           Heart/CV- RRR , no murmur , no gallop  , no rub, nl s1 s2                           - JVD- none , edema- none, stasis changes- none, varices- none           Lung- clear to P&A, wheeze- none, cough- none , dullness-none, rub- none           Chest wall-  Abd-  Br/ Gen/ Rectal- Not done,  not indicated Extrem- cyanosis- none, clubbing, none, atrophy- none, strength- nl Neuro- grossly intact to observation

## 2011-11-02 NOTE — Patient Instructions (Signed)
Script sent for antibiotic biaxin  Ask at pharmacy counter for Mucinex-D  Try using a Neti pot as needed

## 2011-11-05 DIAGNOSIS — J069 Acute upper respiratory infection, unspecified: Secondary | ICD-10-CM | POA: Insufficient documentation

## 2011-11-05 NOTE — Assessment & Plan Note (Addendum)
Upper respiratory infection with bronchitis. Possible sinusitis. Plan-Biaxin, Mucinex D, Neti pot.

## 2011-11-06 NOTE — Telephone Encounter (Signed)
Letter printed and ready for pt to pick up

## 2011-11-16 ENCOUNTER — Encounter: Payer: BC Managed Care – PPO | Admitting: Internal Medicine

## 2011-11-28 NOTE — Telephone Encounter (Signed)
See previous note

## 2012-01-23 ENCOUNTER — Other Ambulatory Visit (INDEPENDENT_AMBULATORY_CARE_PROVIDER_SITE_OTHER): Payer: BC Managed Care – PPO

## 2012-01-23 ENCOUNTER — Encounter: Payer: Self-pay | Admitting: Endocrinology

## 2012-01-23 ENCOUNTER — Ambulatory Visit (INDEPENDENT_AMBULATORY_CARE_PROVIDER_SITE_OTHER): Payer: BC Managed Care – PPO | Admitting: Endocrinology

## 2012-01-23 VITALS — BP 102/72 | HR 75 | Temp 98.2°F | Ht 61.0 in | Wt 244.0 lb

## 2012-01-23 DIAGNOSIS — E0590 Thyrotoxicosis, unspecified without thyrotoxic crisis or storm: Secondary | ICD-10-CM

## 2012-01-23 LAB — TSH: TSH: 8.17 u[IU]/mL — ABNORMAL HIGH (ref 0.35–5.50)

## 2012-01-23 NOTE — Patient Instructions (Addendum)
blood tests are being requested for you today.  You will receive a letter with results.  Please come back for a follow-up appointment in 6 months.   With time the overactive thyroid will almost always come back.  when it does, you should consider taking the radioactive iodine again.

## 2012-01-23 NOTE — Progress Notes (Signed)
  Subjective:    Patient ID: Gwendolyn Scott, female    DOB: 1975-11-30, 36 y.o.   MRN: [id number]  HPI Pt had i-131 rx in 2010, for hyperthyroidism, due to grave's dz.  She had persistent hyperthyroidism, so she was rx'ed with tapazole.  However, she has been off this med x almost 1 year.  pt states she feels well in general, except for a few myalgias. She still takes depo-provera q 3 mos (dr Tenny Craw).     Review of Systems She reports difficulty losing weight    Objective:   Physical Exam VITAL SIGNS:  See vs page GENERAL: no distress NECK: There is no palpable thyroid enlargement.  No thyroid nodule is palpable.  No palpable lymphadenopathy at the anterior neck.      Assessment & Plan:  Hyperthyroidism due to grave's dz.  Off the tapazole, it will almost certainly recur with time.

## 2012-01-24 ENCOUNTER — Telehealth: Payer: Self-pay | Admitting: *Deleted

## 2012-01-24 DIAGNOSIS — E0590 Thyrotoxicosis, unspecified without thyrotoxic crisis or storm: Secondary | ICD-10-CM

## 2012-01-24 NOTE — Telephone Encounter (Signed)
Called pt to inform of lab results, left message for pt to callback office (letter also mailed to pt). 

## 2012-01-25 NOTE — Telephone Encounter (Signed)
Pt informed of lab results. 

## 2012-03-11 ENCOUNTER — Other Ambulatory Visit (INDEPENDENT_AMBULATORY_CARE_PROVIDER_SITE_OTHER): Payer: BC Managed Care – PPO

## 2012-03-11 DIAGNOSIS — E0590 Thyrotoxicosis, unspecified without thyrotoxic crisis or storm: Secondary | ICD-10-CM

## 2012-03-12 ENCOUNTER — Ambulatory Visit (INDEPENDENT_AMBULATORY_CARE_PROVIDER_SITE_OTHER): Payer: BC Managed Care – PPO | Admitting: Family

## 2012-03-12 ENCOUNTER — Encounter: Payer: Self-pay | Admitting: Family

## 2012-03-12 VITALS — BP 102/64 | Temp 98.3°F | Wt 248.0 lb

## 2012-03-12 DIAGNOSIS — M545 Low back pain: Secondary | ICD-10-CM

## 2012-03-12 DIAGNOSIS — T148XXA Other injury of unspecified body region, initial encounter: Secondary | ICD-10-CM

## 2012-03-12 LAB — POCT URINALYSIS DIPSTICK
Bilirubin, UA: NEGATIVE
Glucose, UA: NEGATIVE
Spec Grav, UA: 1.02
Urobilinogen, UA: 0.2

## 2012-03-12 NOTE — Patient Instructions (Addendum)
Muscle Strain A muscle strain, or pulled muscle, occurs when a muscle is over-stretched. A small number of muscle fibers may also be torn. This is especially common in athletes. This happens when a sudden violent force placed on a muscle pushes it past its capacity. Usually, recovery from a pulled muscle takes 1 to 2 weeks. But complete healing will take 5 to 6 weeks. There are millions of muscle fibers. Following injury, your body will usually return to normal quickly. HOME CARE INSTRUCTIONS   While awake, apply ice to the sore muscle for 15 to 20 minutes each hour for the first 2 days. Put ice in a plastic bag and place a towel between the bag of ice and your skin.   Do not use the pulled muscle for several days. Do not use the muscle if you have pain.   You may wrap the injured area with an elastic bandage for comfort. Be careful not to bind it too tightly. This may interfere with blood circulation.   Only take over-the-counter or prescription medicines for pain, discomfort, or fever as directed by your caregiver. Do not use aspirin as this will increase bleeding (bruising) at injury site.   Warming up before exercise helps prevent muscle strains.  SEEK MEDICAL CARE IF:  There is increased pain or swelling in the affected area. MAKE SURE YOU:   Understand these instructions.   Will watch your condition.   Will get help right away if you are not doing well or get worse.  Document Released: 08/14/2005 Document Revised: 08/03/2011 Document Reviewed: 03/13/2007 [employer] Patient Information 2012 [employer]. 

## 2012-03-12 NOTE — Progress Notes (Signed)
Subjective:    Patient ID: Gwendolyn Scott, female    DOB: 04-Dec-1975, 36 y.o.   MRN: [id number]  HPI 36 year old [place] American female, nonsmoker, patient of Dr. Cato Mulligan is in today with complaints of back pain on the left side x3 days. Reports the pain extends from her upper back down to her lower back. She rates it a 3-4/10, nagging, dull ache. The pain is worse with movement. She was recently started on doxycycline for an abscess by the gynecologist in today, has noticed pain improvement. She's had urinary frequency, and urgency but denies any blood in her urine. Denies any shortness of breath, or chest pain.    Review of Systems  Constitutional: Negative.   Respiratory: Negative.   Cardiovascular: Negative.   Genitourinary: Positive for urgency and frequency. Negative for hematuria, vaginal bleeding and vaginal discharge.  Musculoskeletal: Positive for back pain.  Neurological: Negative.   Hematological: Negative.   Psychiatric/Behavioral: Negative.    Past Medical History  Diagnosis Date  . Grave's disease 2009    I-131    History   Social History  . Marital Status: Divorced    Spouse Name: N/A    Number of Children: 0  . Years of Education: N/A   Occupational History  . customer service    Social History Main Topics  . Smoking status: Never Smoker   . Smokeless tobacco: Never Used  . Alcohol Use: No  . Drug Use: No  . Sexually Active: Not on file   Other Topics Concern  . Not on file   Social History Narrative  . No narrative on file    No past surgical history on file.  Family History  Problem Relation Age of Onset  . Hypothyroidism Mother   . Hypothyroidism Maternal Grandmother   . Diabetes Maternal Aunt   . Colon cancer Neg Hx     Allergies  Allergen Reactions  . Penicillins Other (See Comments)    Unknown reaction    Current Outpatient Prescriptions on File Prior to Visit  Medication Sig Dispense Refill  . acetaminophen (TYLENOL) 500 MG  tablet Take 500 mg by mouth every 6 (six) hours as needed.        . medroxyPROGESTERone (DEPO-PROVERA) 150 MG/ML injection Inject 150 mg as directed Every 3 months.      . Simethicone 125 MG CAPS Take 1 capsule (125 mg total) by mouth daily.  28 each  0  . omeprazole (PRILOSEC) 20 MG capsule Take 2 capsules (40 mg total) by mouth daily.  30 capsule  1    BP 102/64  Temp 98.3 F (36.8 C) (Oral)  Wt 248 lb (112.492 kg)chart    Objective:   Physical Exam  Constitutional: She is oriented to person, place, and time. She appears well-developed and well-nourished.  Neck: Normal range of motion. Neck supple.  Cardiovascular: Normal rate and normal heart sounds.   Pulmonary/Chest: Effort normal and breath sounds normal.  Musculoskeletal: Normal range of motion.       Tenderness to palpation of the left upper back. Reproducible and worse with movement.  Neurological: She is alert and oriented to person, place, and time.  Skin: Skin is warm and dry.  Psychiatric: She has a normal mood and affect.          Assessment & Plan:  Assessment: Low back pain, muscle strain  Plan: Over-the-counter Aleve twice daily. Call for a muscle relaxer, patient declines at present time. Ice and heat to the affected area. Patient  call the office if symptoms worsen or persist. Recheck with PCP as scheduled, sooner when necessary.

## 2012-03-13 ENCOUNTER — Telehealth: Payer: Self-pay

## 2012-03-13 NOTE — Telephone Encounter (Signed)
Pt called requesting results of TSH ordered by SAE 07/16. Please advise in Dr George Hugh absence, thanks!

## 2012-03-14 ENCOUNTER — Encounter: Payer: Self-pay | Admitting: Endocrinology

## 2012-03-14 NOTE — Telephone Encounter (Signed)
Pt advised of lab result per SAE letter. Letter mailed as well.

## 2012-05-13 ENCOUNTER — Ambulatory Visit: Payer: BC Managed Care – PPO | Admitting: Endocrinology

## 2012-05-13 DIAGNOSIS — Z0289 Encounter for other administrative examinations: Secondary | ICD-10-CM

## 2012-06-21 ENCOUNTER — Telehealth: Payer: Self-pay | Admitting: Internal Medicine

## 2012-06-21 MED ORDER — SCOPOLAMINE 1 MG/3DAYS TD PT72: 1.0000 | MEDICATED_PATCH | TRANSDERMAL | Status: DC

## 2012-06-21 NOTE — Telephone Encounter (Signed)
Pt would like sea sickness patch for 5 day cruise. Pt is leaving to go on cruise tomorrow. Pt is aware MD out of office.[facility] [place]. Please call patient today

## 2012-06-21 NOTE — Telephone Encounter (Signed)
Ok per Dr Jenkins, rx sent in electronically 

## 2012-06-26 ENCOUNTER — Encounter: Payer: Self-pay | Admitting: Endocrinology

## 2012-06-26 ENCOUNTER — Ambulatory Visit (INDEPENDENT_AMBULATORY_CARE_PROVIDER_SITE_OTHER): Payer: BC Managed Care – PPO | Admitting: Endocrinology

## 2012-06-26 VITALS — BP 120/74 | HR 84 | Temp 98.2°F | Wt 247.0 lb

## 2012-06-26 DIAGNOSIS — E0590 Thyrotoxicosis, unspecified without thyrotoxic crisis or storm: Secondary | ICD-10-CM

## 2012-06-26 NOTE — Patient Instructions (Addendum)
blood tests are being requested for you today.  You will be contacted with results.   Please come back for a follow-up appointment in 6 months.    

## 2012-06-26 NOTE — Progress Notes (Signed)
  Subjective:    Patient ID: Gwendolyn Scott, female    DOB: May 14, 1976, 36 y.o.   MRN: [id number]  HPI Pt had i-131 rx in 2010, for hyperthyroidism, due to grave's dz.  She had persistent hyperthyroidism, so she was rx'ed with tapazole.  However, she has been off this med since 2012.  pt states she feels well in general, except for headache. She still takes depo-provera q 3 mos (dr Tenny Craw).   Past Medical History  Diagnosis Date  . Grave's disease 2009    I-131    No past surgical history on file.  History   Social History  . Marital Status: Divorced    Spouse Name: N/A    Number of Children: 0  . Years of Education: N/A   Occupational History  . customer service    Social History Main Topics  . Smoking status: Never Smoker   . Smokeless tobacco: Never Used  . Alcohol Use: No  . Drug Use: No  . Sexually Active: Not on file   Other Topics Concern  . Not on file   Social History Narrative  . No narrative on file    Current Outpatient Prescriptions on File Prior to Visit  Medication Sig Dispense Refill  . acetaminophen (TYLENOL) 500 MG tablet Take 500 mg by mouth every 6 (six) hours as needed.        . doxycycline (VIBRAMYCIN) 100 MG capsule       . medroxyPROGESTERone (DEPO-PROVERA) 150 MG/ML injection Inject 150 mg as directed Every 3 months.      [name] omeprazole (PRILOSEC) 20 MG capsule Take 2 capsules (40 mg total) by mouth daily.  30 capsule  1  . Simethicone 125 MG CAPS Take 1 capsule (125 mg total) by mouth daily.  28 each  0  . levothyroxine (SYNTHROID, LEVOTHROID) 75 MCG tablet Take 1 tablet (75 mcg total) by mouth daily.  30 tablet  2  . scopolamine (TRANSDERM-SCOP) 1.5 MG Place 1 patch (1.5 mg total) onto the skin every 3 (three) days.  4 patch  0    Allergies  Allergen Reactions  . Penicillins Other (See Comments)    Unknown reaction    Family History  Problem Relation Age of Onset  . Hypothyroidism Mother   . Hypothyroidism Maternal Grandmother   .  Diabetes Maternal Aunt   . Colon cancer Neg Hx     BP 120/74  Pulse 84  Temp 98.2 F (36.8 C) (Oral)  Wt 247 lb (112.038 kg)  SpO2 97%   Review of Systems She also reports night sweats.      Objective:   Physical Exam VITAL SIGNS:  See vs page GENERAL: no distress NECK: There is no palpable thyroid enlargement.  No thyroid nodule is palpable.  No palpable lymphadenopathy at the anterior neck.   Lab Results  Component Value Date   TSH 16.754* 06/26/2012      Assessment & Plan:  Post-i-131 hypothyroidism, needs increased rx

## 2012-06-27 MED ORDER — LEVOTHYROXINE SODIUM 75 MCG PO TABS: 75.0000 ug | ORAL_TABLET | Freq: Every day | ORAL | Status: DC

## 2012-06-28 NOTE — Progress Notes (Signed)
Left message on machine to call if any questions

## 2012-08-06 ENCOUNTER — Other Ambulatory Visit: Payer: BC Managed Care – PPO

## 2012-08-07 ENCOUNTER — Other Ambulatory Visit (INDEPENDENT_AMBULATORY_CARE_PROVIDER_SITE_OTHER): Payer: BC Managed Care – PPO

## 2012-08-07 DIAGNOSIS — Z0000 Encounter for general adult medical examination without abnormal findings: Secondary | ICD-10-CM

## 2012-08-07 LAB — CBC WITH DIFFERENTIAL/PLATELET
Basophils Relative: 0.5 % (ref 0.0–3.0)
Eosinophils Relative: 1.3 % (ref 0.0–5.0)
HCT: 35.6 % — ABNORMAL LOW (ref 36.0–46.0)
Lymphs Abs: 2.2 10*3/uL (ref 0.7–4.0)
MCV: 82.5 fl (ref 78.0–100.0)
Monocytes Absolute: 0.5 10*3/uL (ref 0.1–1.0)
Neutro Abs: 6.2 10*3/uL (ref 1.4–7.7)
RBC: 4.32 Mil/uL (ref 3.87–5.11)
WBC: 9.1 10*3/uL (ref 4.5–10.5)

## 2012-08-07 LAB — POCT URINALYSIS DIPSTICK
Bilirubin, UA: NEGATIVE
Ketones, UA: NEGATIVE
Leukocytes, UA: NEGATIVE
Nitrite, UA: NEGATIVE
Protein, UA: NEGATIVE

## 2012-08-07 LAB — TSH: TSH: 1.97 u[IU]/mL (ref 0.35–5.50)

## 2012-08-07 LAB — LIPID PANEL: VLDL: 10.6 mg/dL (ref 0.0–40.0)

## 2012-08-07 LAB — HEPATIC FUNCTION PANEL
Albumin: 3.3 g/dL — ABNORMAL LOW (ref 3.5–5.2)
Alkaline Phosphatase: 54 U/L (ref 39–117)

## 2012-08-07 LAB — BASIC METABOLIC PANEL
BUN: 8 mg/dL (ref 6–23)
Calcium: 8.7 mg/dL (ref 8.4–10.5)
GFR: 116.39 mL/min (ref >60.00)
Glucose, Bld: 84 mg/dL (ref 70–99)

## 2012-08-14 ENCOUNTER — Ambulatory Visit (INDEPENDENT_AMBULATORY_CARE_PROVIDER_SITE_OTHER): Payer: BC Managed Care – PPO | Admitting: Internal Medicine

## 2012-08-14 ENCOUNTER — Encounter: Payer: Self-pay | Admitting: Internal Medicine

## 2012-08-14 VITALS — BP 120/82 | HR 76 | Temp 98.1°F | Ht 63.0 in | Wt 245.0 lb

## 2012-08-14 DIAGNOSIS — Z0000 Encounter for general adult medical examination without abnormal findings: Secondary | ICD-10-CM

## 2012-08-14 NOTE — Progress Notes (Signed)
Patient ID: Gwendolyn Scott, female   DOB: May 23, 1976, 36 y.o.   MRN: [id number] Cpx  Past Medical History  Diagnosis Date  . Grave's disease 2009    I-131    History   Social History  . Marital Status: Divorced    Spouse Name: N/A    Number of Children: 0  . Years of Education: N/A   Occupational History  . customer service    Social History Main Topics  . Smoking status: Never Smoker   . Smokeless tobacco: Never Used  . Alcohol Use: No  . Drug Use: No  . Sexually Active: Not on file   Other Topics Concern  . Not on file   Social History Narrative  . No narrative on file    No past surgical history on file.  Family History  Problem Relation Age of Onset  . Hypothyroidism Mother   . Hypothyroidism Maternal Grandmother   . Diabetes Maternal Aunt   . Colon cancer Neg Hx     Allergies  Allergen Reactions  . Penicillins Other (See Comments)    Unknown reaction    Current Outpatient Prescriptions on File Prior to Visit  Medication Sig Dispense Refill  . acetaminophen (TYLENOL) 500 MG tablet Take 500 mg by mouth every 6 (six) hours as needed.        . doxycycline (VIBRAMYCIN) 100 MG capsule       . levothyroxine (SYNTHROID, LEVOTHROID) 75 MCG tablet Take 1 tablet (75 mcg total) by mouth daily.  30 tablet  2  . medroxyPROGESTERone (DEPO-PROVERA) 150 MG/ML injection Inject 150 mg as directed Every 3 months.      [name] omeprazole (PRILOSEC) 20 MG capsule Take 2 capsules (40 mg total) by mouth daily.  30 capsule  1  . scopolamine (TRANSDERM-SCOP) 1.5 MG Place 1 patch (1.5 mg total) onto the skin every 3 (three) days.  4 patch  0  . Simethicone 125 MG CAPS Take 1 capsule (125 mg total) by mouth daily.  28 each  0     patient denies chest pain, shortness of breath, orthopnea. Denies lower extremity edema, abdominal pain, change in appetite, change in bowel movements. Patient denies rashes, musculoskeletal complaints. No other specific complaints in a complete review of  systems.   BP 120/82  Pulse 76  Temp 98.1 F (36.7 C) (Oral)  Ht 5\' 3"  (1.6 m)  Wt 245 lb (111.131 kg)  BMI 43.40 kg/m2  Well-developed well-nourished female in no acute distress. HEENT exam atraumatic, normocephalic, extraocular muscles are intact. Neck is supple. No jugular venous distention no thyromegaly. Chest clear to auscultation without increased work of breathing. Cardiac exam S1 and S2 are regular. Abdominal exam active bowel sounds, soft, nontender. Extremities no edema. Neurologic exam she is alert without any motor sensory deficits. Gait is normal.  Well Visit- health maint utd-- discussed need for aggressive weighrt loss.   Note potassium-- i don't think whe needs any work up- will monitor next year.

## 2012-10-22 ENCOUNTER — Encounter: Payer: Self-pay | Admitting: Endocrinology

## 2012-10-22 ENCOUNTER — Ambulatory Visit (INDEPENDENT_AMBULATORY_CARE_PROVIDER_SITE_OTHER): Payer: BC Managed Care – PPO | Admitting: Endocrinology

## 2012-10-22 VITALS — BP 128/70 | HR 80 | Wt 248.0 lb

## 2012-10-22 DIAGNOSIS — E890 Postprocedural hypothyroidism: Secondary | ICD-10-CM

## 2012-10-22 NOTE — Patient Instructions (Addendum)
A thyroid blood test is being requested for you today.  You will be contacted with results. Please come back for a follow-up appointment in 6 months.

## 2012-10-22 NOTE — Progress Notes (Signed)
  Subjective:    Patient ID: Gwendolyn Scott, female    DOB: 1975/09/27, 37 y.o.   MRN: [id number]  HPI Pt had i-131 rx in 2010, for hyperthyroidism, due to grave's dz.  She had persistent hyperthyroidism, so she was rx'ed with tapazole.  However, she has been off this med since 2012.  Off all thyroid medication, she was noted to have elevated TSH late in 2013, so she was rx'ed synthroid.  pt states she feels well in general, except for fatigue. She still takes depo-provera q 3 mos (dr Tenny Craw).     Review of Systems     Objective:   Physical Exam VITAL SIGNS:  See vs page GENERAL: no distress NECK: There is no palpable thyroid enlargement.  No thyroid nodule is palpable.  No palpable lymphadenopathy at the anterior neck.       Assessment & Plan:

## 2012-10-23 ENCOUNTER — Ambulatory Visit: Payer: BC Managed Care – PPO | Admitting: Endocrinology

## 2012-11-01 ENCOUNTER — Ambulatory Visit: Payer: BC Managed Care – PPO | Admitting: Internal Medicine

## 2012-11-12 ENCOUNTER — Telehealth: Payer: Self-pay | Admitting: Endocrinology

## 2012-11-12 MED ORDER — LEVOTHYROXINE SODIUM 75 MCG PO TABS: 75.0000 ug | ORAL_TABLET | Freq: Every day | ORAL | Status: DC

## 2012-11-12 NOTE — Telephone Encounter (Signed)
needs thyroid medication called in. [facility] [phone]. Pt CB# [phone] / Gwendolyn Scott

## 2013-02-10 ENCOUNTER — Encounter: Payer: Self-pay | Admitting: Endocrinology

## 2013-02-10 ENCOUNTER — Ambulatory Visit (INDEPENDENT_AMBULATORY_CARE_PROVIDER_SITE_OTHER): Payer: BC Managed Care – PPO | Admitting: Endocrinology

## 2013-02-10 VITALS — BP 128/76 | HR 78 | Ht 61.0 in | Wt 247.0 lb

## 2013-02-10 DIAGNOSIS — E890 Postprocedural hypothyroidism: Secondary | ICD-10-CM

## 2013-02-10 LAB — TSH: TSH: 1.94 u[IU]/mL (ref 0.35–5.50)

## 2013-02-10 LAB — T4, FREE: Free T4: 0.77 ng/dL (ref 0.60–1.60)

## 2013-02-10 NOTE — Progress Notes (Signed)
  Subjective:    Patient ID: Gwendolyn Scott, female    DOB: Nov 03, 1975, 37 y.o.   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  She had persistent hyperthyroidism, so she was rx'ed with tapazole.  However, she has been off this med since 2012.  Off all thyroid medication, she was noted to have elevated TSH late in 2013, so she was rx'ed synthroid.  pt stopped synthroid 3 days ago, due to headache.  She says off it, the headache is better. She still takes depo-provera q 3 mos (dr Tenny Craw).   Past Medical History  Diagnosis Date  . Grave's disease 2009    I-131    No past surgical history on file.  History   Social History  . Marital Status: Divorced    Spouse Name: N/A    Number of Children: 0  . Years of Education: N/A   Occupational History  . customer service    Social History Main Topics  . Smoking status: Never Smoker   . Smokeless tobacco: Never Used  . Alcohol Use: No  . Drug Use: No  . Sexually Active: Not on file   Other Topics Concern  . Not on file   Social History Narrative  . No narrative on file    Current Outpatient Prescriptions on File Prior to Visit  Medication Sig Dispense Refill  . acetaminophen (TYLENOL) 500 MG tablet Take 500 mg by mouth every 6 (six) hours as needed.        . doxycycline (VIBRAMYCIN) 100 MG capsule       . levothyroxine (SYNTHROID, LEVOTHROID) 75 MCG tablet Take 1 tablet (75 mcg total) by mouth daily.  30 tablet  2  . medroxyPROGESTERone (DEPO-PROVERA) 150 MG/ML injection Inject 150 mg as directed Every 3 months.      [name] scopolamine (TRANSDERM-SCOP) 1.5 MG Place 1 patch (1.5 mg total) onto the skin every 3 (three) days.  4 patch  0  . Simethicone 125 MG CAPS Take 1 capsule (125 mg total) by mouth daily.  28 each  0  . omeprazole (PRILOSEC) 20 MG capsule Take 2 capsules (40 mg total) by mouth daily.  30 capsule  1   No current facility-administered medications on file prior to visit.     Allergies  Allergen Reactions  . Penicillins Other (See Comments)    Unknown reaction   Family History  Problem Relation Age of Onset  . Hypothyroidism Mother   . Hypothyroidism Maternal Grandmother   . Diabetes Maternal Aunt   . Colon cancer Neg Hx    BP 128/76  Pulse 78  Ht 5\' 1"  (1.549 m)  Wt 247 lb (112.038 kg)  BMI 46.69 kg/m2  SpO2 97%  Review of Systems denies blurry vision and tremor.      Objective:   Physical Exam VITAL SIGNS:  See vs page.  GENERAL: no distress. NECK: There is no palpable thyroid enlargement.  No thyroid nodule is palpable.  No palpable lymphadenopathy at the anterior neck. Skin: not diaphoretic Neuro: no tremor.      Assessment & Plan:  Post-i-131 hypothyroidism, on rx.   Headache, not thyroid-related.

## 2013-02-10 NOTE — Patient Instructions (Addendum)
blood tests are being requested for you today.  We'll contact you with results. Please come back for a follow-up appointment in 6 months  

## 2013-04-21 ENCOUNTER — Other Ambulatory Visit: Payer: Self-pay | Admitting: *Deleted

## 2013-04-21 MED ORDER — LEVOTHYROXINE SODIUM 75 MCG PO TABS: 75.0000 ug | ORAL_TABLET | Freq: Every day | ORAL | Status: DC

## 2013-04-23 ENCOUNTER — Ambulatory Visit: Payer: BC Managed Care – PPO | Admitting: Endocrinology

## 2013-05-12 ENCOUNTER — Encounter: Payer: Self-pay | Admitting: Family Medicine

## 2013-05-12 NOTE — Progress Notes (Signed)
No show  This encounter was created in error - please disregard.

## 2013-05-26 ENCOUNTER — Ambulatory Visit: Payer: BC Managed Care – PPO | Admitting: Endocrinology

## 2013-05-26 DIAGNOSIS — Z0289 Encounter for other administrative examinations: Secondary | ICD-10-CM

## 2013-06-06 ENCOUNTER — Encounter: Payer: Self-pay | Admitting: Endocrinology

## 2013-06-06 ENCOUNTER — Ambulatory Visit (INDEPENDENT_AMBULATORY_CARE_PROVIDER_SITE_OTHER): Payer: 59 | Admitting: Endocrinology

## 2013-06-06 VITALS — BP 130/78 | HR 80 | Ht 61.0 in | Wt 248.0 lb

## 2013-06-06 DIAGNOSIS — E890 Postprocedural hypothyroidism: Secondary | ICD-10-CM

## 2013-06-06 LAB — TSH: TSH: 6.18 u[IU]/mL — ABNORMAL HIGH (ref 0.35–5.50)

## 2013-06-06 MED ORDER — LEVOTHYROXINE SODIUM 88 MCG PO TABS: 88.0000 ug | ORAL_TABLET | Freq: Every day | ORAL | Status: DC

## 2013-06-06 NOTE — Progress Notes (Signed)
  Subjective:    Patient ID: Gwendolyn Scott, female    DOB: [birth year], 37 y.o.   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  She had persistent hyperthyroidism, so she was rx'ed with tapazole.  However, she has been off this med since 2012.  Off all thyroid medication, she was noted to have elevated TSH late in 2013, so she was rx'ed synthroid.  She is 2 mos overdue for her next dose of depo-provera (dr Tenny Craw).  She does not want to continue this.  No menses.  She has slight swelling of the legs, but no assoc sob Past Medical History  Diagnosis Date  . Grave's disease 2009    I-131    No past surgical history on file.  History   Social History  . Marital Status: Divorced    Spouse Name: N/A    Number of Children: 0  . Years of Education: N/A   Occupational History  . customer service    Social History Main Topics  . Smoking status: Never Smoker   . Smokeless tobacco: Never Used  . Alcohol Use: No  . Drug Use: No  . Sexual Activity: Not on file   Other Topics Concern  . Not on file   Social History Narrative  . No narrative on file    Current Outpatient Prescriptions on File Prior to Visit  Medication Sig Dispense Refill  . acetaminophen (TYLENOL) 500 MG tablet Take 500 mg by mouth every 6 (six) hours as needed.        . doxycycline (VIBRAMYCIN) 100 MG capsule       . medroxyPROGESTERone (DEPO-PROVERA) 150 MG/ML injection Inject 150 mg as directed Every 3 months.      [name] scopolamine (TRANSDERM-SCOP) 1.5 MG Place 1 patch (1.5 mg total) onto the skin every 3 (three) days.  4 patch  0  . Simethicone 125 MG CAPS Take 1 capsule (125 mg total) by mouth daily.  28 each  0  . omeprazole (PRILOSEC) 20 MG capsule Take 2 capsules (40 mg total) by mouth daily.  30 capsule  1   No current facility-administered medications on file prior to visit.   Allergies  Allergen Reactions  . Penicillins Other (See Comments)    Unknown reaction    Family History  Problem Relation Age of Onset  . Hypothyroidism Mother   . Hypothyroidism Maternal Grandmother   . Diabetes Maternal Aunt   . Colon cancer Neg Hx    BP 130/78  Pulse 80  Ht 5\' 1"  (1.549 m)  Wt 248 lb (112.492 kg)  BMI 46.88 kg/m2  SpO2 98%  Review of Systems Denies weight change and dry skin.    Objective:   Physical Exam VITAL SIGNS:  See vs page GENERAL: no distress NECK: There is no palpable thyroid enlargement.  No thyroid nodule is palpable.  No palpable lymphadenopathy at the anterior neck. Ext: trace bilat edema. Lab Results  Component Value Date   TSH 6.18* 06/06/2013      Assessment & Plan:  Post-i-131 hypothyroidism: she needs increased rx Edema, not thyroid-related. Contraception: she needs gyn f/u.

## 2013-06-06 NOTE — Patient Instructions (Addendum)
blood tests are being requested for you today.  We'll contact you with results.  Please come back for a follow-up appointment in 6 months.   Please come back sooner if the pregnancy happens. In view of your medical condition, you should avoid pregnancy until we have decided it is safe. Please see dr Tenny Craw, for advice on contraception.

## 2013-06-11 ENCOUNTER — Telehealth: Payer: Self-pay | Admitting: Internal Medicine

## 2013-06-12 NOTE — Telephone Encounter (Signed)
[facility] Triage Call Report Triage Record Num: [id number] Operator: Frederico Hamman Patient Name: Gwendolyn Scott Call Date & Time: 06/11/2013 5:33:07PM Patient Phone: [phone] PCP: Valetta Mole. [facility]words Patient Gender: Female PCP Fax : [phone] Patient DOB: 01-20-1976 [facility] Name: [facility]  Reason for Call: Caller: [name]/Patient; PCP: [name] (Adults only); CB#: [phone]; Call regarding Tooth pain and jaw pain, ear and mouth pain; LMP - one year ago- takes Depo- [place] states her wisdom tooth is chipped x ome month. Having aching of mouth and ear onset 06/09/13. Sates mouth is throbbing. Rates pain on 06/11/13 a 7 on 1/10 pt scale. Some puffiness of face. Per teeth and jaw symptoms protocol has see dentist within 24 hrs. States she cannot get appointment with dentist until 10/24. Care advice given. Advised UC/ED  Protocol(s) Used: Teeth and Jaw Symptoms Recommended Outcome per Protocol: See Dentist Within 24 Hours Reason for Outcome: Localized swelling of face AND toothache (without injury)

## 2013-06-17 ENCOUNTER — Ambulatory Visit: Payer: 59 | Admitting: Internal Medicine

## 2013-07-03 ENCOUNTER — Other Ambulatory Visit: Payer: Self-pay

## 2013-07-04 ENCOUNTER — Ambulatory Visit (INDEPENDENT_AMBULATORY_CARE_PROVIDER_SITE_OTHER): Payer: 59 | Admitting: Family

## 2013-07-04 ENCOUNTER — Encounter: Payer: Self-pay | Admitting: Family

## 2013-07-04 ENCOUNTER — Encounter: Payer: Self-pay | Admitting: Endocrinology

## 2013-07-04 ENCOUNTER — Other Ambulatory Visit: Payer: 59

## 2013-07-04 ENCOUNTER — Ambulatory Visit (INDEPENDENT_AMBULATORY_CARE_PROVIDER_SITE_OTHER)
Admission: RE | Admit: 2013-07-04 | Discharge: 2013-07-04 | Disposition: A | Discharge status: Home / Self Care | Payer: 59 | Source: Ambulatory Visit | Attending: Family | Admitting: Family

## 2013-07-04 ENCOUNTER — Ambulatory Visit (INDEPENDENT_AMBULATORY_CARE_PROVIDER_SITE_OTHER): Payer: 59 | Admitting: Endocrinology

## 2013-07-04 VITALS — BP 100/74 | HR 88 | Wt 253.0 lb

## 2013-07-04 VITALS — BP 124/90 | HR 78 | Temp 98.2°F | Ht 61.0 in | Wt 250.7 lb

## 2013-07-04 DIAGNOSIS — E890 Postprocedural hypothyroidism: Secondary | ICD-10-CM

## 2013-07-04 DIAGNOSIS — M25561 Pain in right knee: Secondary | ICD-10-CM

## 2013-07-04 DIAGNOSIS — M25569 Pain in unspecified knee: Secondary | ICD-10-CM

## 2013-07-04 MED ORDER — DICLOFENAC SODIUM 75 MG PO TBEC: 75.0000 mg | DELAYED_RELEASE_TABLET | Freq: Two times a day (BID) | ORAL | Status: DC

## 2013-07-04 NOTE — Patient Instructions (Signed)
Meniscus Tear with Phase I Rehab The meniscus is a C-shaped cartilage structure, located in the knee joint between the thigh bone (femur) and the shinbone (tibia). Two menisci are located in each knee joint: the inner and outer meniscus. The meniscus acts as an adapter between the thigh bone and shinbone, allowing them to fit properly together. It also functions as a shock absorber, to reduce the stress placed on the knee joint and to help supply nutrients to the knee joint cartilage. As people age, the meniscus begins to harden and become more vulnerable to injury. Meniscus tears are a common injury, especially in older athletes. Inner meniscus tears are more common than outer meniscus tears.  SYMPTOMS   Pain in the knee, especially with standing or squatting with the affected leg.  Tenderness along the joint line.  Swelling in the knee joint (effusion), usually starting 1 to 2 days after injury.  Locking or catching of the knee joint, causing inability to straighten the knee completely.  Giving way or buckling of the knee. CAUSES  A meniscus tear occurs when a force is placed on the meniscus that is greater than it can handle. Common causes of injury include:  Direct hit (trauma) to the knee.  Twisting, pivoting, or cutting (rapidly changing direction while running), kneeling or squatting.  Without injury, due to aging. RISK INCREASES WITH:  Contact sports (football, rugby).  Sports in which cleats are used with pivoting (soccer, lacrosse) or sports in which good shoe grip and sudden change in direction are required (racquetball, basketball, squash).  Previous knee injury.  Associated knee injury, particularly ligament injuries.  Poor strength and flexibility. PREVENTION  Warm up and stretch properly before activity.  Maintain physical fitness:  Strength, flexibility, and endurance.  Cardiovascular fitness.  Protect the knee with a brace or elastic bandage.  Wear  properly fitted protective equipment (proper cleats for the surface). PROGNOSIS  Sometimes, meniscus tears heal on their own. However, definitive treatment requires surgery, followed by at least 6 weeks of recovery.  RELATED COMPLICATIONS   Recurring symptoms that result in a chronic problem.  Repeated knee injury, especially if sports are resumed too soon after injury or surgery.  Progression of the tear (the tear gets larger), if untreated.  Arthritis of the knee in later years (with or without surgery).  Complications of surgery, including infection, bleeding, injury to nerves (numbness, weakness, paralysis) continued pain, giving way, locking, nonhealing of meniscus (if repaired), need for further surgery, and knee stiffness (loss of motion). TREATMENT  Treatment first involves the use of ice and medicine, to reduce pain and inflammation. You may find using crutches to walk more comfortable. However, it is okay to bear weight on the injured knee, if the pain will allow it. Surgery is often advised as a definitive treatment. Surgery is performed through an incision near the joint (arthroscopically). The torn piece of the meniscus is removed, and if possible the joint cartilage is repaired. After surgery, the joint must be restrained. After restraint, it is important to perform strengthening and stretching exercises to help regain strength and a full range of motion. These exercises may be completed at home or with a therapist.  MEDICATION  If pain medicine is needed, nonsteroidal anti-inflammatory medicines (aspirin and ibuprofen), or other minor pain relievers (acetaminophen), are often advised.  Do not take pain medicine for 7 days before surgery.  Prescription pain relievers may be given, if your caregiver thinks they are needed. Use only as directed and   only as much as you need. HEAT AND COLD  Cold treatment (icing) should be applied for 10 to 15 minutes every 2 to 3 hours for  inflammation and pain, and immediately after activity that aggravates your symptoms. Use ice packs or an ice massage.  Heat treatment may be used before performing stretching and strengthening activities prescribed by your caregiver, physical therapist, or athletic trainer. Use a heat pack or a warm water soak. SEEK MEDICAL CARE IF:   Symptoms get worse or do not improve in 2 weeks, despite treatment.  New, unexplained symptoms develop. (Drugs used in treatment may produce side effects.) EXERCISES RANGE OF MOTION (ROM) AND STRETCHING EXERCISES - Meniscus Tear, Non-operative, Phase I These are some of the initial exercises with which you may start your rehabilitation program, until you see your caregiver again or until your symptoms are resolved. Remember:   These initial exercises are intended to be gentle. They will help you restore motion without increasing any swelling.  Completing these exercises allows less painful movement and prepares you for the more aggressive strengthening exercises in Phase II.  An effective stretch should be held for at least 30 seconds.  A stretch should never be painful. You should only feel a gentle lengthening or release in the stretched tissue. RANGE OF MOTION - Knee Flexion, Active  Lie on your back with both knees straight. (If this causes back discomfort, bend your healthy knee, placing your foot flat on the floor.)  Slowly slide your heel back toward your buttocks until you feel a gentle stretch in the front of your knee or thigh.  Hold for __________ seconds. Slowly slide your heel back to the starting position. Repeat __________ times. Complete this exercise __________ times per day.  RANGE OF MOTION - Knee Flexion and Extension, Active-Assisted  Sit on the edge of a table or chair with your thighs firmly supported. It may be helpful to place a folded towel under the end of your right / left thigh.  Flexion (bending): Place the ankle of your  healthy leg on top of the other ankle. Use your healthy leg to gently bend your right / left knee until you feel a mild tension across the top of your knee.  Hold for __________ seconds.  Extension (straightening): Switch your ankles so your right / left leg is on top. Use your healthy leg to straighten your right / left knee until you feel a mild tension on the backside of your knee.  Hold for __________ seconds. Repeat __________ times. Complete __________ times per day. STRETCH - Knee Flexion, Supine  Lie on the floor with your right / left heel and foot lightly touching the wall. (Place both feet on the wall if you do not use a door frame.)  Without using any effort, allow gravity to slide your foot down the wall slowly until you feel a gentle stretch in the front of your right / left knee.  Hold this stretch for __________ seconds. Then return the leg to the starting position, using your healthy leg for help, if needed. Repeat __________ times. Complete this stretch __________ times per day.  STRETCH - Knee Extension Sitting  Sit with your right / left leg/heel propped on another chair, coffee table, or foot stool.  Allow your leg muscles to relax, letting gravity straighten out your knee.*  You should feel a stretch behind your right / left knee. Hold this position for __________ seconds. Repeat __________ times. Complete this stretch __________   times per day.  *Your physician, physical therapist or athletic trainer may instruct you place a __________ weight on your thigh, just above your kneecap, to deepen the stretch.  STRENGTHENING EXERCISES - Meniscus Tear, Non-operative, Phase I These exercises may help you when beginning to rehabilitate your injury. They may resolve your symptoms with or without further involvement from your physician, physical therapist or athletic trainer. While completing these exercises, remember:   Muscles can gain both the endurance and the strength  needed for everyday activities through controlled exercises.  Complete these exercises as instructed by your physician, physical therapist or athletic trainer. Progress the resistance and repetitions only as guided. STRENGTH - Quadriceps, Isometrics  Lie on your back with your right / left leg extended and your opposite knee bent.  Gradually tense the muscles in the front of your right / left thigh. You should see either your knee cap slide up toward your hip or increased dimpling just above the knee. This motion will push the back of the knee down toward the floor, mat, or bed on which you are lying.  Hold the muscle as tight as you can, without increasing your pain, for __________ seconds.  Relax the muscles slowly and completely between each repetition. Repeat __________ times. Complete this exercise __________ times per day.  STRENGTH - Quadriceps, Short Arcs   Lie on your back. Place a __________ inch towel roll under your right / left knee, so that the knee bends slightly.  Raise only your lower leg by tightening the muscles in the front of your thigh. Do not allow your thigh to rise.  Hold this position for __________ seconds. Repeat __________ times. Complete this exercise __________ times per day.  OPTIONAL ANKLE WEIGHTS: Begin with ____________________, but DO NOT exceed ____________________. Increase in 1 pound/0.5 kilogram increments. STRENGTH - Quadriceps, Straight Leg Raises  Quality counts! Watch for signs that the quadriceps muscle is working, to be sure you are strengthening the correct muscles and not "cheating" by substituting with healthier muscles.  Lay on your back with your right / left leg extended and your opposite knee bent.  Tense the muscles in the front of your right / left thigh. You should see either your knee cap slide up or increased dimpling just above the knee. Your thigh may even shake a bit.  Tighten these muscles even more and raise your leg 4 to 6  inches off the floor. Hold for __________ seconds.  Keeping these muscles tense, lower your leg.  Relax the muscles slowly and completely in between each repetition. Repeat __________ times. Complete this exercise __________ times per day.  STRENGTH - Hamstring, Curls   Lay on your stomach with your legs extended. (If you lay on a bed, your feet may hang over the edge.)  Tighten the muscles in the back of your thigh to bend your right / left knee up to 90 degrees. Keep your hips flat on the bed.  Hold this position for __________ seconds.  Slowly lower your leg back to the starting position. Repeat __________ times. Complete this exercise __________ times per day.  STRENGTH  Quadriceps, Squats  Stand in a door frame so that your feet and knees are in line with the frame.  Use your hands for balance, not support, on the frame.  Slowly lower your weight, bending at the hips and knees. Keep your lower legs upright so that they are parallel with the door frame. Squat only within the range that does   not increase your knee pain. Never let your hips drop below your knees.  Slowly return upright, pushing with your legs, not pulling with your hands. Repeat __________ times. Complete this exercise __________ times per day.  STRENGTH - Quad/VMO, Isometric   Sit in a chair with your right / left knee slightly bent. With your fingertips, feel the VMO muscle just above the inside of your knee. The VMO is important in controlling the position of your kneecap.  Keeping your fingertips on this muscle. Without actually moving your leg, attempt to drive your knee down as if straightening your leg. You should feel your VMO tense. If you have a difficult time, you may wish to try the same exercise on your healthy knee first.  Tense this muscle as hard as you can without increasing any knee pain.  Hold for __________ seconds. Relax the muscles slowly and completely in between each repetition. Repeat  __________ times. Complete exercise __________ times per day.  Document Released: 08/28/1998 Document Revised: 11/06/2011 Document Reviewed: 11/26/2008 [employer] Patient Information 2014 [employer].    

## 2013-07-04 NOTE — Progress Notes (Signed)
Subjective:    Patient ID: Gwendolyn Scott, female    DOB: 07-May-1976, 37 y.o.   MRN: [id number]  HPI 37 year old [place] American female, patient of Dr. Cato Mulligan is in today with complaints of right knee pain x3 weeks. She denies any acute injury. The pain is worse with walking. She describes it as a sharp pain to the right knee. She has taken Tylenol at times it does not help. The pain as a 10 out of 10 at its worst. She originally noticed the pain awakening from her sleep.  Review of Systems  Constitutional: Negative.   Respiratory: Negative.   Cardiovascular: Negative.   Musculoskeletal: Positive for arthralgias.       Knee pain  Skin: Negative.   Neurological: Negative.   Psychiatric/Behavioral: Negative.    Past Medical History  Diagnosis Date  . Grave's disease 2009    I-131    History   Social History  . Marital Status: Divorced    Spouse Name: N/A    Number of Children: 0  . Years of Education: N/A   Occupational History  . customer service    Social History Main Topics  . Smoking status: Never Smoker   . Smokeless tobacco: Never Used  . Alcohol Use: No  . Drug Use: No  . Sexual Activity: Not on file   Other Topics Concern  . Not on file   Social History Narrative  . No narrative on file    History reviewed. No pertinent past surgical history.  Family History  Problem Relation Age of Onset  . Hypothyroidism Mother   . Hypothyroidism Maternal Grandmother   . Diabetes Maternal Aunt   . Colon cancer Neg Hx     Allergies  Allergen Reactions  . Penicillins Other (See Comments)    Unknown reaction    Current Outpatient Prescriptions on File Prior to Visit  Medication Sig Dispense Refill  . levothyroxine (SYNTHROID, LEVOTHROID) 88 MCG tablet Take 1 tablet (88 mcg total) by mouth daily before breakfast.  30 tablet  6  . acetaminophen (TYLENOL) 500 MG tablet Take 500 mg by mouth every 6 (six) hours as needed.        . doxycycline (VIBRAMYCIN) 100 MG  capsule       . medroxyPROGESTERone (DEPO-PROVERA) 150 MG/ML injection Inject 150 mg as directed Every 3 months.      [name] omeprazole (PRILOSEC) 20 MG capsule Take 2 capsules (40 mg total) by mouth daily.  30 capsule  1  . scopolamine (TRANSDERM-SCOP) 1.5 MG Place 1 patch (1.5 mg total) onto the skin every 3 (three) days.  4 patch  0  . Simethicone 125 MG CAPS Take 1 capsule (125 mg total) by mouth daily.  28 each  0   No current facility-administered medications on file prior to visit.    BP 100/74  Pulse 88  Wt 253 lb (114.76 kg)chart    Objective:   Physical Exam  Constitutional: She is oriented to person, place, and time. She appears well-developed and well-nourished.  Neck: Normal range of motion.  Cardiovascular: Normal rate, regular rhythm and normal heart sounds.   Pulmonary/Chest: Effort normal and breath sounds normal.  Musculoskeletal: She exhibits tenderness. She exhibits no edema.  Tenderness to palpation of the right medial meniscus. No pain with flexion or extension. Pain with weightbearing.  Neurological: She is alert and oriented to person, place, and time.  Skin: Skin is warm and dry.  Psychiatric: She has a normal mood and affect.  Assessment & Plan:  Assessment:  1.  Right knee pain 2. Obesity  Plan: X-ray of the right knee. Diclofenac 75 mg one tablet twice a day with food. Due to the location of the tenderness, she may need an MRI in the future. However, we will consider conservative measures at this point. Ice, elevate, and rest.

## 2013-07-04 NOTE — Progress Notes (Signed)
Subjective:    Patient ID: Gwendolyn Scott, female    DOB: 12-04-1975, 37 y.o.   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  She had persistent hyperthyroidism, so she was rx'ed with tapazole.  However, she has been off this med since 2012.  Off all thyroid medication, she was noted to have elevated TSH late in 2013, so she was rx'ed synthroid.  She is 2 mos overdue for her next dose of depo-provera (dr Tenny Craw).  She does not want to continue this.  No menses.  Since the synthroid was increased, she has slight palpitations in the chest, and assoc leg edema.   Past Medical History  Diagnosis Date  . Grave's disease 2009    I-131    History reviewed. No pertinent past surgical history.  History   Social History  . Marital Status: Divorced    Spouse Name: N/A    Number of Children: 0  . Years of Education: N/A   Occupational History  . customer service    Social History Main Topics  . Smoking status: Never Smoker   . Smokeless tobacco: Never Used  . Alcohol Use: No  . Drug Use: No  . Sexual Activity: Not on file   Other Topics Concern  . Not on file   Social History Narrative  . No narrative on file    Current Outpatient Prescriptions on File Prior to Visit  Medication Sig Dispense Refill  . acetaminophen (TYLENOL) 500 MG tablet Take 500 mg by mouth every 6 (six) hours as needed.        . doxycycline (VIBRAMYCIN) 100 MG capsule       . levothyroxine (SYNTHROID, LEVOTHROID) 88 MCG tablet Take 1 tablet (88 mcg total) by mouth daily before breakfast.  30 tablet  6  . medroxyPROGESTERone (DEPO-PROVERA) 150 MG/ML injection Inject 150 mg as directed Every 3 months.      [name] omeprazole (PRILOSEC) 20 MG capsule Take 2 capsules (40 mg total) by mouth daily.  30 capsule  1  . scopolamine (TRANSDERM-SCOP) 1.5 MG Place 1 patch (1.5 mg total) onto the skin every 3 (three) days.  4 patch  0  . Simethicone 125 MG CAPS Take 1 capsule (125 mg total)  by mouth daily.  28 each  0   No current facility-administered medications on file prior to visit.    Allergies  Allergen Reactions  . Penicillins Other (See Comments)    Unknown reaction    Family History  Problem Relation Age of Onset  . Hypothyroidism Mother   . Hypothyroidism Maternal Grandmother   . Diabetes Maternal Aunt   . Colon cancer Neg Hx    BP 124/90  Pulse 78  Temp(Src) 98.2 F (36.8 C) (Oral)  Ht 5\' 1"  (1.549 m)  Wt 250 lb 11.2 oz (113.717 kg)  BMI 47.39 kg/m2  SpO2 98%  Review of Systems She has insomnia and fatigue.    Objective:   Physical Exam VITAL SIGNS:  See vs page GENERAL: no distress NECK: There is no palpable thyroid enlargement.  No thyroid nodule is palpable.  No palpable lymphadenopathy at the anterior neck. HEART:  Regular rate and rhythm without murmurs noted. Normal S1,S2. Ext: no edema.     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 Amenorrhea, residual from depo-provera.  She needs an alternative  form of contraception.

## 2013-07-04 NOTE — Patient Instructions (Addendum)
blood tests are being requested for you today.  We'll contact you with results.  Please come back for a follow-up appointment in 3 months.   Please come back sooner if the pregnancy happens. In view of your medical condition, you should avoid pregnancy until we have decided it is safe. Please see dr Tenny Craw as scheduled next month, for advice on contraception.

## 2013-07-04 NOTE — Progress Notes (Signed)
Pre-visit discussion using our clinic review tool. No additional management support is needed unless otherwise documented below in the visit note.  

## 2013-10-06 ENCOUNTER — Ambulatory Visit (INDEPENDENT_AMBULATORY_CARE_PROVIDER_SITE_OTHER): Payer: 59 | Admitting: Endocrinology

## 2013-10-06 ENCOUNTER — Encounter: Payer: Self-pay | Admitting: Endocrinology

## 2013-10-06 VITALS — BP 112/72 | HR 81 | Temp 98.7°F | Ht 61.0 in | Wt 253.0 lb

## 2013-10-06 DIAGNOSIS — N912 Amenorrhea, unspecified: Secondary | ICD-10-CM

## 2013-10-06 DIAGNOSIS — E890 Postprocedural hypothyroidism: Secondary | ICD-10-CM

## 2013-10-06 LAB — TSH: TSH: 1.082 u[IU]/mL (ref 0.350–4.500)

## 2013-10-06 LAB — PREGNANCY, URINE: PREG TEST UR: NEGATIVE

## 2013-10-06 NOTE — Progress Notes (Signed)
Subjective:    Patient ID: Gwendolyn Scott, female    DOB: February 15, 1976, 38 y.o.   MRN: [id number]  HPI Pt had i-131 rx in 2010, for hyperthyroidism, due to grave's dz.  She had persistent hyperthyroidism, so she was rx'ed with tapazole.  However, she was able to d/c this med in 2012, due to normalization of her TFT.  Off all thyroid medication, she was noted to have elevated TSH late in 2013, so she was rx'ed synthroid.  She no longer takes depo-provera (dr Harrington Challenger).  Her last injection was almost 1 year ago.  She will see dr Harrington Challenger soon, regarding contraception.  She has no menses.  She has few mos of slight edema of the legs, but no assoc sob. Past Medical History  Diagnosis Date  . Grave's disease 2009    I-131    No past surgical history on file.  History   Social History  . Marital Status: Divorced    Spouse Name: N/A    Number of Children: 0  . Years of Education: N/A   Occupational History  . customer service    Social History Main Topics  . Smoking status: Never Smoker   . Smokeless tobacco: Never Used  . Alcohol Use: No  . Drug Use: No  . Sexual Activity: Not on file   Other Topics Concern  . Not on file   Social History Narrative  . No narrative on file    Current Outpatient Prescriptions on File Prior to Visit  Medication Sig Dispense Refill  . acetaminophen (TYLENOL) 500 MG tablet Take 500 mg by mouth every 6 (six) hours as needed.        . diclofenac (VOLTAREN) 75 MG EC tablet Take 1 tablet (75 mg total) by mouth 2 (two) times daily.  60 tablet  0  . doxycycline (VIBRAMYCIN) 100 MG capsule       . levothyroxine (SYNTHROID, LEVOTHROID) 88 MCG tablet Take 1 tablet (88 mcg total) by mouth daily before breakfast.  30 tablet  6  . medroxyPROGESTERone (DEPO-PROVERA) 150 MG/ML injection Inject 150 mg as directed Every 3 months.      [name] scopolamine (TRANSDERM-SCOP) 1.5 MG Place 1 patch (1.5 mg total) onto the skin every 3 (three) days.  4 patch  0  . Simethicone 125 MG  CAPS Take 1 capsule (125 mg total) by mouth daily.  28 each  0  . omeprazole (PRILOSEC) 20 MG capsule Take 2 capsules (40 mg total) by mouth daily.  30 capsule  1   No current facility-administered medications on file prior to visit.    Allergies  Allergen Reactions  . Penicillins Other (See Comments)    Unknown reaction    Family History  Problem Relation Age of Onset  . Hypothyroidism Mother   . Hypothyroidism Maternal Grandmother   . Diabetes Maternal Aunt   . Colon cancer Neg Hx     BP 112/72  Pulse 81  Temp(Src) 98.7 F (37.1 C) (Oral)  Ht 5\' 1"  (1.549 m)  Wt 253 lb (114.76 kg)  BMI 47.83 kg/m2  SpO2 95%   Review of Systems She reports weight gain.  She has doe.      Objective:   Physical Exam VITAL SIGNS:  See vs page GENERAL: no distress NECK: There is no palpable thyroid enlargement.  No thyroid nodule is palpable.  No palpable lymphadenopathy at the anterior neck. Ext: trace bilat leg edema.     Lab Results  Component Value  Date   TSH 1.082 10/06/2013  preg: neg    Assessment & Plan:  Post-i-131 hypothyroidism, well-replaced Amenorrhea, post-depo-provera Edema: not thyroid-related

## 2013-10-06 NOTE — Patient Instructions (Addendum)
Blood and urine tests are being requested for you today.  We'll contact you with results.  Please come back for a follow-up appointment in 3-4 months.   Please come back sooner if the pregnancy happens. In view of your medical condition, you should avoid pregnancy until we have decided it is safe. Please see dr Harrington Challenger as scheduled, for advice on contraception.

## 2013-11-05 ENCOUNTER — Other Ambulatory Visit: Payer: Self-pay | Admitting: Obstetrics and Gynecology

## 2013-12-02 ENCOUNTER — Ambulatory Visit (INDEPENDENT_AMBULATORY_CARE_PROVIDER_SITE_OTHER): Payer: 59 | Admitting: Family

## 2013-12-02 ENCOUNTER — Encounter: Payer: Self-pay | Admitting: Family

## 2013-12-02 VITALS — BP 100/80 | HR 85 | Temp 98.9°F | Wt 250.0 lb

## 2013-12-02 DIAGNOSIS — J069 Acute upper respiratory infection, unspecified: Secondary | ICD-10-CM

## 2013-12-02 DIAGNOSIS — J209 Acute bronchitis, unspecified: Secondary | ICD-10-CM

## 2013-12-02 MED ORDER — GUAIFENESIN-CODEINE 100-10 MG/5ML PO SYRP: 5.0000 mL | ORAL_SOLUTION | Freq: Three times a day (TID) | ORAL | Status: DC | PRN

## 2013-12-02 MED ORDER — METHYLPREDNISOLONE 4 MG PO KIT: PACK | ORAL | Status: AC

## 2013-12-02 NOTE — Patient Instructions (Signed)

## 2013-12-02 NOTE — Progress Notes (Signed)
Pre visit review using our clinic review tool, if applicable. No additional management support is needed unless otherwise documented below in the visit note. 

## 2013-12-02 NOTE — Progress Notes (Signed)
Subjective:    Patient ID: Gwendolyn Scott, female    DOB: 1976-06-21, 38 y.o.   MRN: [id number]  HPI  38 year old African American female, nonsmoker is in today with complaints of cough, congestion, wheezing, body aches x4 days without relief from over-the-counter medications. Denies any sick contacts.  Review of Systems  Constitutional: Positive for fever, chills and fatigue.  HENT: Positive for congestion, rhinorrhea, sore throat and voice change.   Respiratory: Positive for cough and wheezing. Negative for stridor.   Musculoskeletal: Positive for myalgias.  Skin: Negative.   Allergic/Immunologic: Negative.   Neurological: Negative.   Psychiatric/Behavioral: Negative.    Past Medical History  Diagnosis Date  . Grave's disease 2009    I-131    History   Social History  . Marital Status: Divorced    Spouse Name: N/A    Number of Children: 0  . Years of Education: N/A   Occupational History  . customer service    Social History Main Topics  . Smoking status: Never Smoker   . Smokeless tobacco: Never Used  . Alcohol Use: No  . Drug Use: No  . Sexual Activity: Not on file   Other Topics Concern  . Not on file   Social History Narrative  . No narrative on file    History reviewed. No pertinent past surgical history.  Family History  Problem Relation Age of Onset  . Hypothyroidism Mother   . Hypothyroidism Maternal Grandmother   . Diabetes Maternal Aunt   . Colon cancer Neg Hx     Allergies  Allergen Reactions  . Penicillins Other (See Comments)    Unknown reaction    Current Outpatient Prescriptions on File Prior to Visit  Medication Sig Dispense Refill  . acetaminophen (TYLENOL) 500 MG tablet Take 500 mg by mouth every 6 (six) hours as needed.        . diclofenac (VOLTAREN) 75 MG EC tablet Take 1 tablet (75 mg total) by mouth 2 (two) times daily.  60 tablet  0  . levothyroxine (SYNTHROID, LEVOTHROID) 88 MCG tablet Take 1 tablet (88 mcg total) by  mouth daily before breakfast.  30 tablet  6  . medroxyPROGESTERone (DEPO-PROVERA) 150 MG/ML injection Inject 150 mg as directed Every 3 months.      [name] scopolamine (TRANSDERM-SCOP) 1.5 MG Place 1 patch (1.5 mg total) onto the skin every 3 (three) days.  4 patch  0  . Simethicone 125 MG CAPS Take 1 capsule (125 mg total) by mouth daily.  28 each  0  . doxycycline (VIBRAMYCIN) 100 MG capsule       . omeprazole (PRILOSEC) 20 MG capsule Take 2 capsules (40 mg total) by mouth daily.  30 capsule  1   No current facility-administered medications on file prior to visit.    BP 100/80  Pulse 85  Temp(Src) 98.9 F (37.2 C) (Oral)  Wt 250 lb (113.399 kg)  SpO2 98%chart    Objective:   Physical Exam  Constitutional: She is oriented to person, place, and time. She appears well-developed and well-nourished.  HENT:  Right Ear: External ear normal.  Left Ear: External ear normal.  Nose: Nose normal.  Mouth/Throat: Oropharynx is clear and moist.  Nasal turbinates red and edematous  Neck: Normal range of motion. Neck supple.  Cardiovascular: Normal rate, regular rhythm and normal heart sounds.   Pulmonary/Chest: Effort normal and breath sounds normal.  Musculoskeletal: Normal range of motion.  Neurological: She is alert and oriented to person,  place, and time.  Skin: Skin is warm and dry.  Psychiatric: She has a normal mood and affect.          Assessment & Plan:  Kamaria was seen today for cough, nasal congestion, generalized body aches, sore throat and otalgia.  Diagnoses and associated orders for this visit:  Acute upper respiratory infections of unspecified site  Acute bronchitis  Other Orders - methylPREDNISolone (MEDROL DOSEPAK) 4 MG tablet; follow package directions - guaiFENesin-codeine (CHERATUSSIN AC) 100-10 MG/5ML syrup; Take 5 mLs by mouth 3 (three) times daily as needed.   Call the office with any questions or concerns. Recheck as scheduled and as needed.

## 2013-12-03 ENCOUNTER — Telehealth: Payer: Self-pay | Admitting: Internal Medicine

## 2013-12-03 NOTE — Telephone Encounter (Signed)
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   Note ready for pick up. Pt will pick note up Monday

## 2013-12-03 NOTE — Telephone Encounter (Signed)
Pt was seen yesterday and would like a work note from 12-02-16 and return to work on 12-08-13. Pt saw NP. Please call patient when note is ready for pick up

## 2014-01-02 ENCOUNTER — Encounter (HOSPITAL_COMMUNITY): Payer: Self-pay | Admitting: Pharmacist

## 2014-01-16 ENCOUNTER — Ambulatory Visit (HOSPITAL_COMMUNITY): Admission: RE | Admit: 2014-01-16 | Payer: 59 | Source: Ambulatory Visit | Admitting: Obstetrics and Gynecology

## 2014-01-16 ENCOUNTER — Other Ambulatory Visit: Payer: Self-pay | Admitting: Obstetrics and Gynecology

## 2014-01-16 ENCOUNTER — Encounter (HOSPITAL_COMMUNITY): Admission: RE | Payer: Self-pay | Source: Ambulatory Visit

## 2014-01-16 SURGERY — DILATATION AND CURETTAGE /HYSTEROSCOPY
Anesthesia: Choice

## 2014-01-16 NOTE — Progress Notes (Signed)
Patient is a 5 year black female who is to  have a D&C and hysteroscopy for persistent irregular bleeding on Depo Provera which has not responded to medical treatment.   Medications: Synthroid 80 mcg  PMH: Positive for Graves Disease  Allergies; Penicillins  ROS:  Respiratory: No cough or SOB GI: No nausea, vomiting, constipation or diarrhea GU: No dysuria, frequency or urgency Gyn: No discharge or itch just persistent irregular bleeding  Physical exam  BP 120/78      Resp 16      Pulse 84     Wt 249  HEENT: PERRLA   Normocephalic and atraumatic Chest: Cleat to P&A Heart: regular rhythm no murmur or gallop Abdomen: soft non tender. No masses present   Normal LKKS Pelvic Exam:   External genitalia:  wNL   BUS: WNL   Vagina:No lesions normal discharge   Cervix: Without lesion and non tender   Uterus: Anterior normal size shape and position   Adnexa: No masses noted  Impression: Irregular bleeding on Depo Provera without response to medical therapy  Plan: D&C and hysteroscopy  Risk and Benefits were discussed with the patient and informed consent has been obtained.

## 2014-01-30 ENCOUNTER — Telehealth: Payer: Self-pay | Admitting: Endocrinology

## 2014-01-30 MED ORDER — LEVOTHYROXINE SODIUM 88 MCG PO TABS: 88.0000 ug | ORAL_TABLET | Freq: Every day | ORAL | Status: DC

## 2014-01-30 NOTE — Telephone Encounter (Signed)
Thyroid med needs to be refilled

## 2014-01-30 NOTE — Telephone Encounter (Signed)
Rx sent to pharmacy   

## 2014-03-13 ENCOUNTER — Ambulatory Visit (INDEPENDENT_AMBULATORY_CARE_PROVIDER_SITE_OTHER): Payer: 59 | Admitting: Endocrinology

## 2014-03-13 ENCOUNTER — Encounter: Payer: Self-pay | Admitting: Endocrinology

## 2014-03-13 VITALS — BP 122/78 | HR 83 | Temp 98.6°F | Ht 61.0 in | Wt 241.0 lb

## 2014-03-13 DIAGNOSIS — E890 Postprocedural hypothyroidism: Secondary | ICD-10-CM

## 2014-03-13 LAB — TSH: TSH: 0.29 u[IU]/mL — ABNORMAL LOW (ref 0.35–4.50)

## 2014-03-13 MED ORDER — LEVOTHYROXINE SODIUM 75 MCG PO TABS: 75.0000 ug | ORAL_TABLET | Freq: Every day | ORAL | Status: DC

## 2014-03-13 NOTE — Patient Instructions (Addendum)
Blood and urine tests are being requested for you today.  We'll contact you with results.  Please come back for a follow-up appointment in 6 months.   Please come back sooner if a pregnancy happens. In view of your medical condition, you should avoid pregnancy until we have decided it is safe.

## 2014-03-13 NOTE — Progress Notes (Signed)
Subjective:    Patient ID: Gwendolyn Scott, female    DOB: 03/06/1976, 38 y.o.   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  She has resumed depo-provera (Dr Harrington Challenger).  pt states she feels well in general.   Past Medical History  Diagnosis Date  . Grave's disease 2009    I-131    No past surgical history on file.  History   Social History  . Marital Status: Divorced    Spouse Name: N/A    Number of Children: 0  . Years of Education: N/A   Occupational History  . customer service    Social History Main Topics  . Smoking status: Never Smoker   . Smokeless tobacco: Never Used  . Alcohol Use: No  . Drug Use: No  . Sexual Activity: Not on file   Other Topics Concern  . Not on file   Social History Narrative  . No narrative on file    Current Outpatient Prescriptions on File Prior to Visit  Medication Sig Dispense Refill  . acetaminophen (TYLENOL) 500 MG tablet Take 500 mg by mouth every 6 (six) hours as needed.        . diclofenac (VOLTAREN) 75 MG EC tablet Take 1 tablet (75 mg total) by mouth 2 (two) times daily.  60 tablet  0  . doxycycline (VIBRAMYCIN) 100 MG capsule       . guaiFENesin-codeine (CHERATUSSIN AC) 100-10 MG/5ML syrup Take 5 mLs by mouth 3 (three) times daily as needed.  120 mL  0  . medroxyPROGESTERone (DEPO-PROVERA) 150 MG/ML injection Inject 150 mg as directed Every 3 months.      [name] scopolamine (TRANSDERM-SCOP) 1.5 MG Place 1 patch (1.5 mg total) onto the skin every 3 (three) days.  4 patch  0  . Simethicone 125 MG CAPS Take 1 capsule (125 mg total) by mouth daily.  28 each  0  . omeprazole (PRILOSEC) 20 MG capsule  Take 2 capsules (40 mg total) by mouth daily.  30 capsule  1   No current facility-administered medications on file prior to visit.    Allergies  Allergen Reactions  . Penicillins Other (See Comments)    Unknown reaction    Family History  Problem Relation Age of Onset  . Hypothyroidism Mother   . Hypothyroidism Maternal Grandmother   . Diabetes Maternal Aunt   . Colon cancer Neg Hx     BP 122/78  Pulse 83  Temp(Src) 98.6 F (37 C) (Oral)  Ht 5\' 1"  (1.549 m)  Wt 241 lb (109.317 kg)  BMI 45.56 kg/m2  SpO2 97%   Review of Systems Denies weight change    Objective:   Physical Exam VITAL SIGNS:  See vs page GENERAL: no distress Skin: not diaphoretic Neuro: no tremor  Lab Results  Component Value Date   TSH 0.29* 03/13/2014      Assessment & Plan:  Hypothyroidism, minimally overreplaced.    Patient is advised the following: Patient Instructions  Blood and urine tests are being requested for you today.  We'll contact you with results.  Please come back for a follow-up appointment in 6  months.   Please come back sooner if a pregnancy happens. In view of your medical condition, you should avoid pregnancy until we have decided it is safe.

## 2014-04-08 ENCOUNTER — Ambulatory Visit: Payer: 59 | Admitting: Physician Assistant

## 2014-04-23 ENCOUNTER — Telehealth: Payer: Self-pay | Admitting: Internal Medicine

## 2014-04-23 NOTE — Telephone Encounter (Signed)
Pt would like to est with NP. Can I sch?

## 2014-04-27 ENCOUNTER — Telehealth: Payer: Self-pay | Admitting: Endocrinology

## 2014-04-27 MED ORDER — LEVOTHYROXINE SODIUM 75 MCG PO TABS: 75.0000 ug | ORAL_TABLET | Freq: Every day | ORAL | Status: DC

## 2014-04-27 NOTE — Telephone Encounter (Signed)
Patient need refill of Levothyroxine 75 mg.

## 2014-04-27 NOTE — Telephone Encounter (Signed)
Rx sent per pt's request.  

## 2014-04-28 NOTE — Telephone Encounter (Signed)
lmom for pt to cb

## 2014-04-28 NOTE — Telephone Encounter (Signed)
Ok with me 

## 2014-04-29 NOTE — Telephone Encounter (Signed)
appt has been scheduled.

## 2014-04-29 NOTE — Telephone Encounter (Signed)
Pt has been sch

## 2014-05-20 ENCOUNTER — Ambulatory Visit: Payer: 59 | Admitting: Family

## 2014-05-28 ENCOUNTER — Ambulatory Visit: Payer: 59 | Admitting: Family

## 2014-06-12 ENCOUNTER — Other Ambulatory Visit: Payer: Self-pay

## 2014-06-23 ENCOUNTER — Ambulatory Visit (INDEPENDENT_AMBULATORY_CARE_PROVIDER_SITE_OTHER): Payer: 59 | Admitting: Endocrinology

## 2014-06-23 ENCOUNTER — Encounter: Payer: Self-pay | Admitting: Endocrinology

## 2014-06-23 VITALS — BP 126/88 | HR 74 | Temp 98.5°F | Ht 61.0 in | Wt 241.0 lb

## 2014-06-23 DIAGNOSIS — E890 Postprocedural hypothyroidism: Secondary | ICD-10-CM

## 2014-06-23 LAB — TSH: TSH: 2.14 u[IU]/mL (ref 0.35–4.50)

## 2014-06-23 LAB — T4, FREE: Free T4: 1.18 ng/dL (ref 0.60–1.60)

## 2014-06-23 NOTE — Progress Notes (Signed)
Subjective:    Patient ID: Gwendolyn Scott, female    DOB: 05-Feb-1976, 38 y.o.   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  She has resumed depo-provera (Dr Harrington Challenger).  pt states she feels well in general.  She has lost a few lbs.   Past Medical History  Diagnosis Date  . Grave's disease 2009    I-131    No past surgical history on file.  History   Social History  . Marital Status: Divorced    Spouse Name: N/A    Number of Children: 0  . Years of Education: N/A   Occupational History  . customer service    Social History Main Topics  . Smoking status: Never Smoker   . Smokeless tobacco: Never Used  . Alcohol Use: No  . Drug Use: No  . Sexual Activity: Not on file   Other Topics Concern  . Not on file   Social History Narrative  . No narrative on file    Current Outpatient Prescriptions on File Prior to Visit  Medication Sig Dispense Refill  . acetaminophen (TYLENOL) 500 MG tablet Take 500 mg by mouth every 6 (six) hours as needed.        . diclofenac (VOLTAREN) 75 MG EC tablet Take 1 tablet (75 mg total) by mouth 2 (two) times daily.  60 tablet  0  . doxycycline (VIBRAMYCIN) 100 MG capsule       . guaiFENesin-codeine (CHERATUSSIN AC) 100-10 MG/5ML syrup Take 5 mLs by mouth 3 (three) times daily as needed.  120 mL  0  . levothyroxine (SYNTHROID, LEVOTHROID) 75 MCG tablet Take 1 tablet (75 mcg total) by mouth daily before breakfast.  30 tablet  7  . medroxyPROGESTERone (DEPO-PROVERA) 150 MG/ML injection Inject 150 mg as directed Every 3 months.      [name] scopolamine (TRANSDERM-SCOP) 1.5 MG Place 1 patch (1.5 mg total) onto the skin  every 3 (three) days.  4 patch  0  . Simethicone 125 MG CAPS Take 1 capsule (125 mg total) by mouth daily.  28 each  0  . omeprazole (PRILOSEC) 20 MG capsule Take 2 capsules (40 mg total) by mouth daily.  30 capsule  1   No current facility-administered medications on file prior to visit.    Allergies  Allergen Reactions  . Penicillins Other (See Comments)    Unknown reaction    Family History  Problem Relation Age of Onset  . Hypothyroidism Mother   . Hypothyroidism Maternal Grandmother   . Diabetes Maternal Aunt   . Colon cancer Neg Hx     BP 126/88  Pulse 74  Temp(Src) 98.5 F (36.9 C) (Oral)  Ht 5\' 1"  (1.549 m)  Wt 241 lb (109.317 kg)  BMI 45.56 kg/m2  SpO2 96%  Review of Systems Denies tremor.      Objective:   Physical Exam VITAL SIGNS:  See vs page GENERAL: no distress NECK: There is no palpable thyroid enlargement.  No thyroid nodule is palpable.  No palpable lymphadenopathy at the anterior neck.    Lab Results  Component Value Date   TSH 2.14 06/23/2014  Assessment & Plan:  Hypothyroidism, well-replaced  Patient is advised the following: Patient Instructions  blood tests are being requested for you today.  We'll contact you with results. Based on the results, i'll prescribe for you a 90-day supply of levothyroxine.   Please come back for a follow-up appointment in 6 months.

## 2014-06-23 NOTE — Patient Instructions (Signed)
blood tests are being requested for you today.  We'll contact you with results. Based on the results, i'll prescribe for you a 90-day supply of levothyroxine.   Please come back for a follow-up appointment in 6 months.

## 2014-07-01 ENCOUNTER — Ambulatory Visit: Payer: 59 | Admitting: Endocrinology

## 2014-07-01 ENCOUNTER — Ambulatory Visit: Payer: 59 | Admitting: Family

## 2014-07-01 DIAGNOSIS — Z0289 Encounter for other administrative examinations: Secondary | ICD-10-CM

## 2014-08-11 ENCOUNTER — Ambulatory Visit: Payer: 59 | Admitting: Family

## 2014-08-26 ENCOUNTER — Ambulatory Visit (INDEPENDENT_AMBULATORY_CARE_PROVIDER_SITE_OTHER): Payer: 59 | Admitting: Family

## 2014-08-26 ENCOUNTER — Encounter: Payer: Self-pay | Admitting: Family

## 2014-08-26 VITALS — BP 100/70 | HR 90 | Temp 98.1°F | Ht 61.0 in | Wt 242.9 lb

## 2014-08-26 DIAGNOSIS — Z1239 Encounter for other screening for malignant neoplasm of breast: Secondary | ICD-10-CM

## 2014-08-26 DIAGNOSIS — K219 Gastro-esophageal reflux disease without esophagitis: Secondary | ICD-10-CM

## 2014-08-26 DIAGNOSIS — Z0000 Encounter for general adult medical examination without abnormal findings: Secondary | ICD-10-CM

## 2014-08-26 DIAGNOSIS — Z1231 Encounter for screening mammogram for malignant neoplasm of breast: Secondary | ICD-10-CM

## 2014-08-26 LAB — POCT URINALYSIS DIPSTICK
Bilirubin, UA: NEGATIVE
GLUCOSE UA: NEGATIVE
KETONES UA: NEGATIVE
LEUKOCYTES UA: NEGATIVE
Nitrite, UA: NEGATIVE
Protein, UA: NEGATIVE
Spec Grav, UA: 1.02
Urobilinogen, UA: 0.2
pH, UA: 5.5

## 2014-08-26 LAB — COMPREHENSIVE METABOLIC PANEL
ALK PHOS: 60 U/L (ref 39–117)
ALT: 8 U/L (ref 0–35)
AST: 12 U/L (ref 0–37)
Albumin: 3.4 g/dL — ABNORMAL LOW (ref 3.5–5.2)
BUN: 7 mg/dL (ref 6–23)
CHLORIDE: 109 meq/L (ref 96–112)
CO2: 27 mEq/L (ref 19–32)
CREATININE: 0.7 mg/dL (ref 0.4–1.2)
Calcium: 9.1 mg/dL (ref 8.4–10.5)
GFR: 115.16 mL/min (ref >60.00)
GLUCOSE: 100 mg/dL — AB (ref 70–99)
Potassium: 3.6 mEq/L (ref 3.5–5.1)
Sodium: 141 mEq/L (ref 135–145)
Total Bilirubin: 0.7 mg/dL (ref 0.2–1.2)
Total Protein: 7.8 g/dL (ref 6.0–8.3)

## 2014-08-26 LAB — LIPID PANEL
Cholesterol: 130 mg/dL (ref 0–200)
HDL: 31.9 mg/dL — ABNORMAL LOW (ref >39.00)
LDL Cholesterol: 81 mg/dL (ref 0–99)
NonHDL: 98.1
TRIGLYCERIDES: 84 mg/dL (ref 0.0–149.0)
Total CHOL/HDL Ratio: 4
VLDL: 16.8 mg/dL (ref 0.0–40.0)

## 2014-08-26 LAB — CBC WITH DIFFERENTIAL/PLATELET
BASOS ABS: 0 10*3/uL (ref 0.0–0.1)
Basophils Relative: 0.3 % (ref 0.0–3.0)
EOS PCT: 1.7 % (ref 0.0–5.0)
Eosinophils Absolute: 0.2 10*3/uL (ref 0.0–0.7)
HEMATOCRIT: 37 % (ref 36.0–46.0)
HEMOGLOBIN: 11.9 g/dL — AB (ref 12.0–15.0)
Lymphocytes Relative: 27.4 % (ref 12.0–46.0)
Lymphs Abs: 2.6 10*3/uL (ref 0.7–4.0)
MCHC: 32.2 g/dL (ref 30.0–36.0)
MCV: 82.3 fl (ref 78.0–100.0)
Monocytes Absolute: 0.5 10*3/uL (ref 0.1–1.0)
Monocytes Relative: 5.3 % (ref 3.0–12.0)
NEUTROS ABS: 6.2 10*3/uL (ref 1.4–7.7)
Neutrophils Relative %: 65.3 % (ref 43.0–77.0)
Platelets: 364 10*3/uL (ref 150.0–400.0)
RBC: 4.49 Mil/uL (ref 3.87–5.11)
RDW: 16.6 % — AB (ref 11.5–15.5)
WBC: 9.5 10*3/uL (ref 4.0–10.5)

## 2014-08-26 MED ORDER — LEVOTHYROXINE SODIUM 75 MCG PO TABS: 75.0000 ug | ORAL_TABLET | Freq: Every day | ORAL | Status: DC

## 2014-08-26 MED ORDER — OMEPRAZOLE 40 MG PO CPDR: 40.0000 mg | DELAYED_RELEASE_CAPSULE | Freq: Every day | ORAL | Status: DC

## 2014-08-26 NOTE — Progress Notes (Signed)
Subjective:    Patient ID: Gwendolyn Scott, female    DOB: 07-03-1976, 38 y.o.   MRN: [id number]  HPI  38 year old African-American female, nonsmoker with a history of hypothyroidism currently under the care of endocrinology, GERD is in today for complete physical exam. Is seeing orthopedics for knee pain and is currently taking diclofenac that she does not feel works. Does not routinely exercise. Has not had a mammogram.   Review of Systems  Constitutional: Negative.   HENT: Negative.   Eyes: Negative.   Respiratory: Negative.   Cardiovascular: Negative.   Gastrointestinal: Negative.   Endocrine: Negative.   Genitourinary: Negative.   Musculoskeletal: Positive for arthralgias. Negative for back pain and joint swelling.       Bilateral knee pain  Skin: Negative.   Allergic/Immunologic: Negative.   Neurological: Negative.   Hematological: Negative.   Psychiatric/Behavioral: Negative.   All other systems reviewed and are negative.  Past Medical History  Diagnosis Date  . Grave's disease 2009    I-131    History   Social History  . Marital Status: Divorced    Spouse Name: N/A    Number of Children: 0  . Years of Education: N/A   Occupational History  . customer service    Social History Main Topics  . Smoking status: Never Smoker   . Smokeless tobacco: Never Used  . Alcohol Use: No  . Drug Use: No  . Sexual Activity: Not on file   Other Topics Concern  . Not on file   Social History Narrative    History reviewed. No pertinent past surgical history.  Family History  Problem Relation Age of Onset  . Hypothyroidism Mother   . Hypothyroidism Maternal Grandmother   . Diabetes Maternal Aunt   . Colon cancer Neg Hx     Allergies  Allergen Reactions  . Penicillins Other (See Comments)    Unknown reaction    Current Outpatient Prescriptions on File Prior to Visit  Medication Sig Dispense Refill  . acetaminophen (TYLENOL) 500 MG tablet Take 500 mg by  mouth every 6 (six) hours as needed.      . diclofenac (VOLTAREN) 75 MG EC tablet Take 1 tablet (75 mg total) by mouth 2 (two) times daily. 60 tablet 0  . medroxyPROGESTERone (DEPO-PROVERA) 150 MG/ML injection Inject 150 mg as directed Every 3 months.     No current facility-administered medications on file prior to visit.    BP 100/70 mmHg  Pulse 90  Temp(Src) 98.1 F (36.7 C) (Oral)  Ht 5\' 1"  (1.549 m)  Wt 242 lb 14.4 oz (110.179 kg)  BMI 45.92 kg/m2chart    Objective:   Physical Exam  Constitutional: She is oriented to person, place, and time. She appears well-developed and well-nourished.  HENT:  Head: Normocephalic and atraumatic.  Right Ear: External ear normal.  Left Ear: External ear normal.  Nose: Nose normal.  Mouth/Throat: Oropharynx is clear and moist.  Eyes: Conjunctivae and EOM are normal. Pupils are equal, round, and reactive to light.  Neck: Normal range of motion. Neck supple. No thyromegaly present.  Cardiovascular: Normal rate, regular rhythm and normal heart sounds.   Pulmonary/Chest: Effort normal and breath sounds normal.  Abdominal: Soft. Bowel sounds are normal.  Musculoskeletal: Normal range of motion.  Neurological: She is alert and oriented to person, place, and time. She has normal reflexes. She displays normal reflexes. No cranial nerve deficit. Coordination normal.  Skin: Skin is warm and dry.  Psychiatric: She has  a normal mood and affect.          Assessment & Plan:  Salley was seen today for swords transfer.  Diagnoses and associated orders for this visit:  Preventative health care - EKG 12-Lead - CMP - Lipid Panel - CBC with Differential - POC Urinalysis Dipstick  Breast cancer screening, high risk patient - MM Digital Screening; Future  Gastroesophageal reflux disease without esophagitis  Other Orders - omeprazole (PRILOSEC) 40 MG capsule; Take 1 capsule (40 mg total) by mouth daily. - levothyroxine (SYNTHROID, LEVOTHROID)  75 MCG tablet; Take 1 tablet (75 mcg total) by mouth daily before breakfast.    Encouraged healthy diet, exercise, monthly self breast exams and weight reduction. Low calorie diet. Follow with orthopedics as scheduled. Continue current medications.

## 2014-08-26 NOTE — Patient Instructions (Signed)
Exercise to Lose Weight Exercise and a healthy diet may help you lose weight. Your doctor may suggest specific exercises. EXERCISE IDEAS AND TIPS  Choose low-cost things you enjoy doing, such as walking, bicycling, or exercising to workout videos.  Take stairs instead of the elevator.  Walk during your lunch break.  Park your car further away from work or school.  Go to a gym or an exercise class.  Start with 5 to 10 minutes of exercise each day. Build up to 30 minutes of exercise 4 to 6 days a week.  Wear shoes with good support and comfortable clothes.  Stretch before and after working out.  Work out until you breathe harder and your heart beats faster.  Drink extra water when you exercise.  Do not do so much that you hurt yourself, feel dizzy, or get very short of breath. Exercises that burn about 150 calories:  Running 1  miles in 15 minutes.  Playing volleyball for 45 to 60 minutes.  Washing and waxing a car for 45 to 60 minutes.  Playing touch football for 45 minutes.  Walking 1  miles in 35 minutes.  Pushing a stroller 1  miles in 30 minutes.  Playing basketball for 30 minutes.  Raking leaves for 30 minutes.  Bicycling 5 miles in 30 minutes.  Walking 2 miles in 30 minutes.  Dancing for 30 minutes.  Shoveling snow for 15 minutes.  Swimming laps for 20 minutes.  Walking up stairs for 15 minutes.  Bicycling 4 miles in 15 minutes.  Gardening for 30 to 45 minutes.  Jumping rope for 15 minutes.  Washing windows or floors for 45 to 60 minutes. Document Released: 09/16/2010 Document Revised: 11/06/2011 Document Reviewed: 09/16/2010 [employer] Patient Information 2015 [employer]. This information is not intended to replace advice given to you by your health care provider. Make sure you discuss any questions you have with your health care provider.  

## 2014-08-26 NOTE — Progress Notes (Signed)
Pre visit review using our clinic review tool, if applicable. No additional management support is needed unless otherwise documented below in the visit note. 

## 2014-09-14 ENCOUNTER — Other Ambulatory Visit: Payer: Self-pay | Admitting: Family

## 2014-09-14 DIAGNOSIS — R921 Mammographic calcification found on diagnostic imaging of breast: Secondary | ICD-10-CM

## 2014-09-15 ENCOUNTER — Ambulatory Visit: Payer: Self-pay | Admitting: Licensed Clinical Social Worker

## 2014-09-17 ENCOUNTER — Telehealth: Payer: Self-pay | Admitting: Family

## 2014-09-17 MED ORDER — CLINDAMYCIN HCL 300 MG PO CAPS: 300.0000 mg | ORAL_CAPSULE | Freq: Three times a day (TID) | ORAL | Status: DC

## 2014-09-17 NOTE — Telephone Encounter (Signed)
Pt said she has a chip tooth and is in pain. Dentist can not see her until Friday and she is asking if Abby Potash will call her in something for pain    Pharmacy ; [facility] [place]

## 2014-09-17 NOTE — Telephone Encounter (Signed)
Per Padonda, abx clindamycin 300mg  tid x 7 days and 800mg  ibuprofen OTC  Pt aware and abx sent to pharmacy

## 2014-09-24 ENCOUNTER — Ambulatory Visit: Payer: Self-pay | Admitting: Licensed Clinical Social Worker

## 2014-10-08 ENCOUNTER — Other Ambulatory Visit: Payer: Self-pay

## 2014-10-15 ENCOUNTER — Other Ambulatory Visit: Payer: Self-pay | Admitting: Family

## 2014-10-15 ENCOUNTER — Ambulatory Visit
Admission: RE | Admit: 2014-10-15 | Discharge: 2014-10-15 | Disposition: A | Discharge status: Home / Self Care | Payer: 59 | Source: Ambulatory Visit | Attending: Family | Admitting: Family

## 2014-10-15 DIAGNOSIS — R921 Mammographic calcification found on diagnostic imaging of breast: Secondary | ICD-10-CM

## 2014-10-15 DIAGNOSIS — N6320 Unspecified lump in the left breast, unspecified quadrant: Secondary | ICD-10-CM

## 2014-10-22 ENCOUNTER — Ambulatory Visit (INDEPENDENT_AMBULATORY_CARE_PROVIDER_SITE_OTHER): Payer: 59 | Admitting: Family

## 2014-10-22 ENCOUNTER — Encounter: Payer: Self-pay | Admitting: Family

## 2014-10-22 VITALS — BP 138/80 | HR 88 | Temp 98.3°F | Wt 245.7 lb

## 2014-10-22 DIAGNOSIS — M545 Low back pain, unspecified: Secondary | ICD-10-CM

## 2014-10-22 DIAGNOSIS — E039 Hypothyroidism, unspecified: Secondary | ICD-10-CM

## 2014-10-22 DIAGNOSIS — R350 Frequency of micturition: Secondary | ICD-10-CM

## 2014-10-22 DIAGNOSIS — R319 Hematuria, unspecified: Secondary | ICD-10-CM

## 2014-10-22 LAB — POCT URINALYSIS DIPSTICK
BILIRUBIN UA: NEGATIVE
GLUCOSE UA: NEGATIVE
KETONES UA: NEGATIVE
LEUKOCYTES UA: NEGATIVE
Nitrite, UA: NEGATIVE
PH UA: 6.5
Protein, UA: NEGATIVE
Spec Grav, UA: 1.015
Urobilinogen, UA: 0.2

## 2014-10-22 LAB — TSH: TSH: 1.52 u[IU]/mL (ref 0.35–4.50)

## 2014-10-22 NOTE — Progress Notes (Signed)
Subjective:    Patient ID: Gwendolyn Scott, female    DOB: Jun 28, 1976, 39 y.o.   MRN: [id number]  HPI 39 year old African-American female, nonsmoker with a history of hypothyroidism is in today with complaints of low back pain 2 weeks. Reports urinary frequency and urgency. Pain comes and goes. Has been taken Tylenol that helps. Describes the pain as achy. Pain a 5 out of 10. Heating pad helps. Denies any injury. Has a history of osteoarthritis of the knees. Reports increased fatigue.   Review of Systems  Constitutional: Negative.   HENT: Negative.   Respiratory: Negative.   Cardiovascular: Negative.   Gastrointestinal: Negative.   Endocrine: Negative.   Genitourinary: Positive for urgency and frequency.       Vaginal spotting due to menstrual cycle.   Musculoskeletal: Positive for back pain and arthralgias.       Knee pain and low back pain.   Skin: Negative.   Allergic/Immunologic: Negative.   Neurological: Negative.   Psychiatric/Behavioral: Negative.    Past Medical History  Diagnosis Date  . Grave's disease 2009    I-131    History   Social History  . Marital Status: Divorced    Spouse Name: N/A  . Number of Children: 0  . Years of Education: N/A   Occupational History  . customer service    Social History Main Topics  . Smoking status: Never Smoker   . Smokeless tobacco: Never Used  . Alcohol Use: No  . Drug Use: No  . Sexual Activity: Not on file   Other Topics Concern  . Not on file   Social History Narrative    History reviewed. No pertinent past surgical history.  Family History  Problem Relation Age of Onset  . Hypothyroidism Mother   . Hypothyroidism Maternal Grandmother   . Diabetes Maternal Aunt   . Colon cancer Neg Hx     Allergies  Allergen Reactions  . Penicillins Other (See Comments)    Unknown reaction    Current Outpatient Prescriptions on File Prior to Visit  Medication Sig Dispense Refill  . acetaminophen (TYLENOL) 500  MG tablet Take 500 mg by mouth every 6 (six) hours as needed.      . clindamycin (CLEOCIN) 300 MG capsule Take 1 capsule (300 mg total) by mouth 3 (three) times daily. 21 capsule 0  . diclofenac (VOLTAREN) 75 MG EC tablet Take 1 tablet (75 mg total) by mouth 2 (two) times daily. 60 tablet 0  . levothyroxine (SYNTHROID, LEVOTHROID) 75 MCG tablet Take 1 tablet (75 mcg total) by mouth daily before breakfast. 90 tablet 1  . medroxyPROGESTERone (DEPO-PROVERA) 150 MG/ML injection Inject 150 mg as directed Every 3 months.    [name] omeprazole (PRILOSEC) 40 MG capsule Take 1 capsule (40 mg total) by mouth daily. 90 capsule 1   No current facility-administered medications on file prior to visit.    BP 138/80 mmHg  Pulse 88  Temp(Src) 98.3 F (36.8 C) (Oral)  Wt 245 lb 11.2 oz (111.449 kg)  SpO2 99%chart    Objective:   Physical Exam  Constitutional: She appears well-developed and well-nourished.  HENT:  Right Ear: External ear normal.  Left Ear: External ear normal.  Nose: Nose normal.  Mouth/Throat: Oropharynx is clear and moist.  Neck: Normal range of motion. Neck supple. No thyromegaly present.  Cardiovascular: Normal rate, regular rhythm and normal heart sounds.   Pulmonary/Chest: Effort normal and breath sounds normal.  Abdominal: Soft. Bowel sounds are normal.  Musculoskeletal:  She exhibits tenderness. She exhibits no edema.  Left CVA tenderness. However, tenderness to palpation of the lumbar spine. No pain with range of motion.  Neurological: She is alert. She has normal reflexes. She displays normal reflexes. No cranial nerve deficit. Coordination normal.  Skin: Skin is warm and dry.  Psychiatric: She has a normal mood and affect.          Assessment & Plan:  Jolonda was seen today for back pain.  Diagnoses and all orders for this visit:  Hematuria Orders: -     Culture, Urine  Urinary frequency Orders: -     POCT Urine Dip -     Culture, Urine  Hypothyroidism,  unspecified hypothyroidism type Orders: -     TSH -     T3 -     T4  Bilateral low back pain without sciatica   Call the office with any questions or concerns. Continue Tylenol as needed for pain. Urine culture sent. Follow-up with orthopedics if necessary.

## 2014-10-22 NOTE — Patient Instructions (Signed)
Back Pain, Adult Low back pain is very common. About 1 in 5 people have back pain.The cause of low back pain is rarely dangerous. The pain often gets better over time.About half of people with a sudden onset of back pain feel better in just 2 weeks. About 8 in 10 people feel better by 6 weeks.  CAUSES Some common causes of back pain include:  Strain of the muscles or ligaments supporting the spine.  Wear and tear (degeneration) of the spinal discs.  Arthritis.  Direct injury to the back. DIAGNOSIS Most of the time, the direct cause of low back pain is not known.However, back pain can be treated effectively even when the exact cause of the pain is unknown.Answering your caregiver's questions about your overall health and symptoms is one of the most accurate ways to make sure the cause of your pain is not dangerous. If your caregiver needs more information, he or she may order lab work or imaging tests (X-rays or MRIs).However, even if imaging tests show changes in your back, this usually does not require surgery. HOME CARE INSTRUCTIONS For many people, back pain returns.Since low back pain is rarely dangerous, it is often a condition that people can learn to manageon their own.   Remain active. It is stressful on the back to sit or stand in one place. Do not sit, drive, or stand in one place for more than 30 minutes at a time. Take short walks on level surfaces as soon as pain allows.Try to increase the length of time you walk each day.  Do not stay in bed.Resting more than 1 or 2 days can delay your recovery.  Do not avoid exercise or work.Your body is made to move.It is not dangerous to be active, even though your back may hurt.Your back will likely heal faster if you return to being active before your pain is gone.  Pay attention to your body when you bend and lift. Many people have less discomfortwhen lifting if they bend their knees, keep the load close to their bodies,and  avoid twisting. Often, the most comfortable positions are those that put less stress on your recovering back.  Find a comfortable position to sleep. Use a firm mattress and lie on your side with your knees slightly bent. If you lie on your back, put a pillow under your knees.  Only take over-the-counter or prescription medicines as directed by your caregiver. Over-the-counter medicines to reduce pain and inflammation are often the most helpful.Your caregiver may prescribe muscle relaxant drugs.These medicines help dull your pain so you can more quickly return to your normal activities and healthy exercise.  Put ice on the injured area.  Put ice in a plastic bag.  Place a towel between your skin and the bag.  Leave the ice on for 15-20 minutes, 03-04 times a day for the first 2 to 3 days. After that, ice and heat may be alternated to reduce pain and spasms.  Ask your caregiver about trying back exercises and gentle massage. This may be of some benefit.  Avoid feeling anxious or stressed.Stress increases muscle tension and can worsen back pain.It is important to recognize when you are anxious or stressed and learn ways to manage it.Exercise is a great option. SEEK MEDICAL CARE IF:  You have pain that is not relieved with rest or medicine.  You have pain that does not improve in 1 week.  You have new symptoms.  You are generally not feeling well. SEEK   IMMEDIATE MEDICAL CARE IF:   You have pain that radiates from your back into your legs.  You develop new bowel or bladder control problems.  You have unusual weakness or numbness in your arms or legs.  You develop nausea or vomiting.  You develop abdominal pain.  You feel faint. Document Released: 08/14/2005 Document Revised: 02/13/2012 Document Reviewed: 12/16/2013 [employer] Patient Information 2015 [employer]. This information is not intended to replace advice given to you by your health care provider. Make sure you  discuss any questions you have with your health care provider.  

## 2014-10-22 NOTE — Progress Notes (Signed)
Pre visit review using our clinic review tool, if applicable. No additional management support is needed unless otherwise documented below in the visit note. 

## 2014-10-23 LAB — T4: T4, Total: 8 ug/dL (ref 4.5–12.0)

## 2014-10-23 LAB — T3: T3, Total: 91.8 ng/dL (ref 80.0–204.0)

## 2014-10-24 LAB — URINE CULTURE

## 2014-10-28 ENCOUNTER — Other Ambulatory Visit: Payer: Self-pay | Admitting: Family

## 2014-10-28 DIAGNOSIS — N6320 Unspecified lump in the left breast, unspecified quadrant: Secondary | ICD-10-CM

## 2014-10-29 ENCOUNTER — Ambulatory Visit
Admission: RE | Admit: 2014-10-29 | Discharge: 2014-10-29 | Disposition: A | Discharge status: Home / Self Care | Payer: 59 | Source: Ambulatory Visit | Attending: Family | Admitting: Family

## 2014-10-29 DIAGNOSIS — N6320 Unspecified lump in the left breast, unspecified quadrant: Secondary | ICD-10-CM

## 2014-11-05 ENCOUNTER — Ambulatory Visit: Payer: 59 | Admitting: Endocrinology

## 2014-11-12 ENCOUNTER — Ambulatory Visit: Payer: 59 | Admitting: Endocrinology

## 2014-11-19 ENCOUNTER — Encounter: Payer: Self-pay | Admitting: Endocrinology

## 2014-11-19 ENCOUNTER — Ambulatory Visit (INDEPENDENT_AMBULATORY_CARE_PROVIDER_SITE_OTHER): Payer: 59 | Admitting: Endocrinology

## 2014-11-19 VITALS — BP 138/90 | HR 75 | Temp 98.4°F | Ht 61.0 in | Wt 242.0 lb

## 2014-11-19 DIAGNOSIS — E890 Postprocedural hypothyroidism: Secondary | ICD-10-CM

## 2014-11-19 NOTE — Patient Instructions (Addendum)
Please continue the same thyroid medication, Your thyroid level is normal on less than a full daily amount of thyroid medication.  This means that your thyroid was not completely destroyed by the radioactive iodine.  This in turn means that your medication need will increase or decrease over time, but is unlikely to stay the same.  Please come back for a follow-up appointment in 6 months.

## 2014-11-19 NOTE — Progress Notes (Signed)
Subjective:    Patient ID: Gwendolyn Scott, female    DOB: 09-Dec-1975, 39 y.o.   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  She takes depo-provera (Dr Harrington Challenger).  pt states she feels well in general, except for fatigue.   Past Medical History  Diagnosis Date  . Grave's disease 2009    I-131    No past surgical history on file.  History   Social History  . Marital Status: Divorced    Spouse Name: N/A  . Number of Children: 0  . Years of Education: N/A   Occupational History  . customer service    Social History Main Topics  . Smoking status: Never Smoker   . Smokeless tobacco: Never Used  . Alcohol Use: No  . Drug Use: No  . Sexual Activity: Not on file   Other Topics Concern  . Not on file   Social History Narrative    Current Outpatient Prescriptions on File Prior to Visit  Medication Sig Dispense Refill  . acetaminophen (TYLENOL) 500 MG tablet Take 500 mg by mouth every 6 (six) hours as needed.      . clindamycin (CLEOCIN) 300 MG capsule Take 1 capsule (300 mg total) by mouth 3 (three) times daily. 21 capsule 0  . diclofenac (VOLTAREN) 75 MG EC tablet Take 1 tablet (75 mg total) by mouth 2 (two) times daily. 60 tablet 0  . levothyroxine (SYNTHROID, LEVOTHROID) 75 MCG tablet Take 1 tablet (75 mcg total) by mouth daily before breakfast. 90 tablet 1  . medroxyPROGESTERone (DEPO-PROVERA) 150 MG/ML injection Inject 150 mg as directed Every 3 months.    [name] omeprazole (PRILOSEC) 40 MG capsule Take 1 capsule (40 mg total) by mouth daily. 90 capsule 1   No current facility-administered medications on file prior to visit.    Allergies    Allergen Reactions  . Penicillins Other (See Comments)    Unknown reaction    Family History  Problem Relation Age of Onset  . Hypothyroidism Mother   . Hypothyroidism Maternal Grandmother   . Diabetes Maternal Aunt   . Colon cancer Neg Hx     BP 138/90 mmHg  Pulse 75  Temp(Src) 98.4 F (36.9 C) (Oral)  Ht 5\' 1"  (4.132 m)  Wt 242 lb (109.77 kg)  BMI 45.75 kg/m2  SpO2 98%    Review of Systems She has lost a few lbs.      Objective:   Physical Exam VITAL SIGNS:  See vs page GENERAL: no distress NECK: There is no palpable thyroid enlargement.  No thyroid nodule is palpable.  No palpable lymphadenopathy at the anterior neck.   Lab Results  Component Value Date   TSH 1.52 10/22/2014   T3TOTAL 91.8 10/22/2014   T4TOTAL 8.0 10/22/2014      Assessment & Plan:  Hypothyroidism: well-replaced  Patient is advised the following: Patient Instructions  Please continue the same thyroid medication, Your thyroid level is normal on less than a full daily amount of thyroid medication.  This means that your thyroid was not completely destroyed by the radioactive iodine.  This in turn means that your medication need  will increase or decrease over time, but is unlikely to stay the same.  Please come back for a follow-up appointment in 6 months.

## 2014-11-26 ENCOUNTER — Ambulatory Visit: Payer: 59 | Admitting: Family Medicine

## 2014-12-03 ENCOUNTER — Ambulatory Visit (INDEPENDENT_AMBULATORY_CARE_PROVIDER_SITE_OTHER): Payer: 59 | Admitting: Family

## 2014-12-03 ENCOUNTER — Encounter: Payer: Self-pay | Admitting: Family

## 2014-12-03 VITALS — BP 120/82 | HR 88 | Temp 98.3°F | Wt 245.3 lb

## 2014-12-03 DIAGNOSIS — G5601 Carpal tunnel syndrome, right upper limb: Secondary | ICD-10-CM | POA: Diagnosis not present

## 2014-12-03 MED ORDER — METHYLPREDNISOLONE 4 MG PO KIT: PACK | ORAL | Status: AC

## 2014-12-03 NOTE — Progress Notes (Signed)
Pre visit review using our clinic review tool, if applicable. No additional management support is needed unless otherwise documented below in the visit note. 

## 2014-12-03 NOTE — Patient Instructions (Addendum)
Take Medrol to reduce inflammation and pain. Continue Diclofenac for inflammation. Wrist Splint.Carpal Tunnel Release Carpal tunnel release is done to relieve the pressure on the nerves and tendons on the bottom side of your wrist.  LET YOUR CAREGIVER KNOW ABOUT:   Allergies to food or medicine.  Medicines taken, including vitamins, herbs, eyedrops, over-the-counter medicines, and creams.  Use of steroids (by mouth or creams).  Previous problems with anesthetics or numbing medicines.  History of bleeding problems or blood clots.  Previous surgery.  Other health problems, including diabetes and kidney problems.  Possibility of pregnancy, if this applies. RISKS AND COMPLICATIONS  Some problems that may happen after this procedure include:  Infection.  Damage to the nerves, arteries or tendons could occur. This would be very uncommon.  Bleeding. BEFORE THE PROCEDURE   This surgery may be done while you are asleep (general anesthetic) or may be done under a block where only your forearm and the surgical area is numb.  If the surgery is done under a block, the numbness will gradually wear off within several hours after surgery. HOME CARE INSTRUCTIONS   Have a responsible person with you for 24 hours.  Do not drive a car or use public transportation for 24 hours.  Only take over-the-counter or prescription medicines for pain, discomfort, or fever as directed by your caregiver. Take them as directed.  You may put ice on the palm side of the affected wrist.  Put ice in a plastic bag.  Place a towel between your skin and the bag.  Leave the ice on for 20 to 30 minutes, 4 times per day.  If you were given a splint to keep your wrist from bending, use it as directed. It is important to wear the splint at night or as directed. Use the splint for as long as you have pain or numbness in your hand, arm, or wrist. This may take 1 to 2 months.  Keep your hand raised (elevated)  above the level of your heart as much as possible. This keeps swelling down and helps with discomfort.  Change bandages (dressings) as directed.  Keep the wound clean and dry. SEEK MEDICAL CARE IF:   You develop pain not relieved with medications.  You develop numbness of your hand.  You develop bleeding from your surgical site.  You have an oral temperature above 102 F (38.9 C).  You develop redness or swelling of the surgical site.  You develop new, unexplained problems. SEEK IMMEDIATE MEDICAL CARE IF:   You develop a rash.  You have difficulty breathing.  You develop any reaction or side effects to medications given. Document Released: 11/04/2003 Document Revised: 11/06/2011 Document Reviewed: 06/20/2007 [facility] Patient Information 2015 [place], [place]. This information is not intended to replace advice given to you by your health care provider. Make sure you discuss any questions you have with your health care provider.

## 2014-12-03 NOTE — Progress Notes (Signed)
Subjective:    Patient ID: Gwendolyn Scott, female    DOB: 19-Aug-1976, 39 y.o.   MRN: [id number]  HPI  Acute Visit  Patient seen, 39 year old, African American female seen today with complaint of pain second and fourth digits, hand, and wrist. Patient has a past medical history of carpal tunnel and hypothyroidism. 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 She reports pain times three weeks, rates 7/10 in severity.  Pain is mildly controlled with heat, tylenol, and rest.     Review of Systems  Constitutional: Negative.   HENT: Negative.   Eyes: Negative.   Respiratory: Negative.   Cardiovascular: Negative.   Gastrointestinal: Negative.   Endocrine: Negative.   Genitourinary: Negative.   Musculoskeletal: Positive for myalgias and arthralgias. Negative for back pain, joint swelling and gait problem.       See HPI  Skin: Negative.   Allergic/Immunologic: Negative.   Neurological: Negative.   Hematological: Negative.   Psychiatric/Behavioral: Negative.        Past Medical History  Diagnosis Date  . Grave's disease 2009    I-131    History   Social History  . Marital Status: Divorced    Spouse Name: N/A  . Number of Children: 0  . Years of Education: N/A   Occupational History  . customer service    Social History Main Topics  . Smoking status: Never Smoker   . Smokeless tobacco: Never Used  . Alcohol Use: No  . Drug Use: No  . Sexual Activity: Not on file   Other Topics Concern  . Not on file   Social History Narrative    No past surgical history on file.  Family History  Problem Relation Age of Onset  . Hypothyroidism Mother   . Hypothyroidism Maternal Grandmother   . Diabetes Maternal Aunt   . Colon cancer Neg Hx     Allergies  Allergen Reactions  . Penicillins Other (See Comments)    Unknown reaction    Current Outpatient Prescriptions on File Prior to Visit  Medication Sig Dispense Refill  .  acetaminophen (TYLENOL) 500 MG tablet Take 500 mg by mouth every 6 (six) hours as needed.      . diclofenac (VOLTAREN) 75 MG EC tablet Take 1 tablet (75 mg total) by mouth 2 (two) times daily. 60 tablet 0  . levothyroxine (SYNTHROID, LEVOTHROID) 75 MCG tablet Take 1 tablet (75 mcg total) by mouth daily before breakfast. 90 tablet 1  . medroxyPROGESTERone (DEPO-PROVERA) 150 MG/ML injection Inject 150 mg as directed Every 3 months.    [name] omeprazole (PRILOSEC) 40 MG capsule Take 1 capsule (40 mg total) by mouth daily. 90 capsule 1   No current facility-administered medications on file prior to visit.    BP 120/82 mmHg  Pulse 88  Temp(Src) 98.3 F (36.8 C) (Oral)  Wt 245 lb 4.8 oz (111.267 kg)chart  Objective:   Physical Exam  Constitutional: She is oriented to person, place, and time. She appears well-developed and well-nourished.  HENT:  Head: Normocephalic and atraumatic.  Right Ear: External ear normal.  Left Ear: External ear normal.  Nose: Nose normal.  Mouth/Throat: Oropharynx is clear and moist.  Eyes: Conjunctivae and EOM are normal. Pupils are equal, round, and reactive to light.  Neck: Normal range of motion. Neck supple.  Cardiovascular: Normal rate, regular rhythm, normal heart sounds and intact distal pulses.   Pulmonary/Chest: Effort normal and breath sounds normal.  Musculoskeletal:  She exhibits tenderness.  Right hand and wrist diminished muscle strength and pain produced with active/passive ROM.  Neurological: She is alert and oriented to person, place, and time.  DTR in right arm and left arm symmetrical +2  Skin: Skin is warm and dry.  Psychiatric: She has a normal mood and affect. Her behavior is normal. Judgment and thought content normal.  Nursing note and vitals reviewed.         Assessment & Plan:  Venise was seen today for arm pain.  Diagnoses and all orders for this visit:  Carpal tunnel syndrome of right wrist Orders: -     Ambulatory referral  to Physical Therapy -     Wrist splint  Other orders -     methylPREDNISolone (MEDROL DOSEPAK) 4 MG tablet; follow package directions   Plan: Continue to take diclofenac. But hold while taking Medrol. Call the office with any questions or concerns. Recheck as needed.

## 2014-12-09 DIAGNOSIS — Z0279 Encounter for issue of other medical certificate: Secondary | ICD-10-CM

## 2014-12-15 ENCOUNTER — Telehealth: Payer: Self-pay | Admitting: Family

## 2014-12-15 ENCOUNTER — Other Ambulatory Visit: Payer: Self-pay | Admitting: *Deleted

## 2014-12-15 DIAGNOSIS — G5601 Carpal tunnel syndrome, right upper limb: Secondary | ICD-10-CM

## 2014-12-15 MED ORDER — TRAMADOL HCL 50 MG PO TABS: 50.0000 mg | ORAL_TABLET | Freq: Three times a day (TID) | ORAL | Status: DC | PRN

## 2014-12-15 NOTE — Telephone Encounter (Signed)
Pt said her carpal tunnel is really hurting and is asking if something can be called in for her .     Pharmacy ; [address]

## 2014-12-15 NOTE — Telephone Encounter (Signed)
Per Padonda, tramadol '50mg'$  tid prn pain and referral to neurology for nerve conduction study and continue diclofenac.  Referral placed. Rx phoned in and pt is aware

## 2014-12-17 ENCOUNTER — Ambulatory Visit: Payer: 59 | Admitting: Family

## 2014-12-18 ENCOUNTER — Encounter: Payer: Self-pay | Admitting: Family

## 2014-12-18 ENCOUNTER — Telehealth: Payer: Self-pay

## 2014-12-18 DIAGNOSIS — G5600 Carpal tunnel syndrome, unspecified upper limb: Secondary | ICD-10-CM | POA: Insufficient documentation

## 2014-12-18 NOTE — Telephone Encounter (Signed)
Spoke with pt concerning FMLA and STD forms received. Padonda unclear about need because she was not taken out of work due to recent dx of carpal tunnel.   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 She says that she was using a plug-in heating pad at her desk that helped alleviate the pain while she typed, but was told that she can no longer use it because of the possibility of it interrupting the computer system. She says that because of that she now has to take breaks from typing throughout her shift because the pain is so severe. She notes that when she left work on Monday she was nearly in tears because of the pain and has not been back in since then. She says she works 10 hour days and [employer] has recently added Saturday to the work schedule. She said she plans on going back to work on Monday, 12/21/14.  Pt has PT scheduled to start 12/24/14 and the nerve conduction study 01/14/15, but will receive a call if a sooner appointment becomes available. She is currently unaware of how often PT will be but will likely need clearance due to work schedule.

## 2014-12-22 ENCOUNTER — Other Ambulatory Visit: Payer: Self-pay | Admitting: Sports Medicine

## 2014-12-22 DIAGNOSIS — M542 Cervicalgia: Secondary | ICD-10-CM

## 2014-12-23 ENCOUNTER — Other Ambulatory Visit: Payer: Self-pay

## 2014-12-24 ENCOUNTER — Ambulatory Visit: Payer: 59 | Attending: Family | Admitting: Occupational Therapy

## 2014-12-24 DIAGNOSIS — M79641 Pain in right hand: Secondary | ICD-10-CM | POA: Insufficient documentation

## 2014-12-24 DIAGNOSIS — M25641 Stiffness of right hand, not elsewhere classified: Secondary | ICD-10-CM | POA: Insufficient documentation

## 2014-12-24 NOTE — Therapy (Addendum)
[facility] [address] [place], [place], [address] Phone: [phone]   Fax:  [phone]  Occupational Therapy Evaluation  Patient Details  Name: Gwendolyn Scott MRN: [id number] Date of Birth: 1976/08/20 Referring Provider:  Kennyth Arnold, FNP  Encounter Date: 12/24/2014      OT End of Session - 12/24/14 1517    Visit Number 1   Number of Visits 17   Date for OT Re-Evaluation 02/21/15   Authorization Type [employer] 60visit limit   OT Start Time 1318   OT Stop Time 1400   OT Time Calculation (min) 42 min   Activity Tolerance Patient tolerated treatment well   Behavior During Therapy [facility] for tasks assessed/performed      Past Medical History  Diagnosis Date  . Grave's disease 2009    I-131    No past surgical history on file.  There were no vitals filed for this visit.  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   Limitations pain, numbness   Currently in Pain? Yes   Pain Score 8   sometimes 10/10 at night   Pain Orientation Right   Pain Descriptors / Indicators Burning;Numbness;Tingling;Throbbing   Pain Type Acute pain   Pain Onset More than a month ago  Pain increased x 1 month ago   Aggravating Factors  use   Pain Relieving Factors heat   Effect of Pain on Daily Activities Limits pt's ability to perform work activities/    Multiple Pain Sites No           [facility] OT Assessment - 12/24/14 0001    Assessment   Diagnosis right UE carpal tunnel   Onset Date --  started 6-7 yrs ago, worsened 1 month ago   Assessment Pt with right CTS presents with pain and limited functional use which impedes performance of ADLS/IADLS.   Prior Therapy OT 6-7 years ago   Precautions   Precautions  Other (comment)   Required Braces or Orthoses Other Brace/Splint  Pt was issued a wrist brace by MD   Home  Environment   Lives With Family  grandmother   Prior Function   Level of Independence Independent with basic ADLs;Independent with homemaking with ambulation  working for [employer] time employment   [employer], works 10 hr days   ADL   Eating/Feeding Independent  uses left hand   Upper Body Bathing Minimal assistance   Lower Body Bathing Minimal assistance   Upper Body Dressing Minimal assistance  pulling shirt overhead   Lower Body Dressing Minimal assistance  pulling up pants   ADL comments Pt reports difficulty opening containers and gripping.   Mobility   Mobility Status Independent   Written Expression   Dominant Hand Right   Handwriting --  writes with left hand currently due to pain   Observation/Other Assessments   Outcome Measures Quick DASH: 86.3% impairment   Sensation   Light Touch Impaired by gross assessment  pt reports decreased sensation in fingertips/ thumb   Additional Comments Pt reports RUE is hypersensitive   Coordination   Fine Motor Movements are Fluid and Coordinated No   Coordination impaired by pain   ROM / Strength  AROM / PROM / Strength AROM   AROM   Overall AROM  Deficits   Overall AROM Comments grossly 30% composite finger flexion/  85% composite extension   Right/Left Wrist Right   Right Wrist Extension 35 Degrees   Right Wrist Flexion 40 Degrees   Right Hand AROM   R Thumb MCP 0-60 25 Degrees   R Thumb IP 0-80 65 Degrees   R Thumb Opposition to Index --  unable to oppose digits with thumb   Hand Function   Right Hand Grip (lbs) unable due to pain   Left Hand Grip (lbs) 55 lbs      Paraffin applied to right hand and wrist x 10 mins for pain relief. No adverse reactions.                     OT Short Term Goals - 12/24/14 1525    OT SHORT TERM GOAL #1   Title I with inital  HEP   Baseline check 01/22/15   Time 4   Period Weeks   Status New   OT SHORT TERM GOAL #2   Title Pt will demonstrate at least 75 % composite finger flexion in RUE for increased functional use.   Time 4   Period Weeks   Status New   OT SHORT TERM GOAL #3   Title Pt will use RUE to assist with ADLS/ IADLS at least 79% of the time with pain no greater than 5 /10   Time 4   Period Weeks   Status New   OT SHORT TERM GOAL #4   Title Pt will report she is performing all basic ADLS independently.   Time 4   Period Weeks   Status New   OT SHORT TERM GOAL #5   Title Pt will verbalize understanding of adapted strategies for positioning/ ADLS to minimize pain and symptoms.   Time 4   Period Weeks   Status New           OT Long Term Goals - 12/24/14 1633    OT LONG TERM GOAL #1   Title Pt will demonstrate at least 90% composite flexion for increased functional use.   Baseline Check 02/21/15   Time 8   Period Weeks   Status New   OT LONG TERM GOAL #2   Title Pt will demonstrate RUE grip strength of at least 25 lbs for functional use.   Time 8   Period Weeks   Status New   OT LONG TERM GOAL #3   Title Pt will resume use of RUE as dominant hand at least 75% of the time.   Time 8   Period Weeks   Status New   OT LONG TERM GOAL #4   Title Pt will improve her Quick DASH score to at least 40% impairment or less.   Time 8   Period Weeks   Status New               Plan - 12/24/14 1519    Clinical Impression Statement Pt with right CTS presents with significant pain, and decreased A/ROM which impedes performance of ADLS/Work activities.   Pt will benefit from skilled therapeutic intervention in order to improve on the following deficits (Retired) Impaired flexibility;Pain;Impaired sensation;Decreased coordination;Decreased activity tolerance;Decreased endurance;Decreased range of motion;Decreased strength;Impaired UE functional use   Rehab Potential Good   Clinical  Impairments Affecting Rehab Potential pain, decreased strength, decreased A/ROM   OT Frequency 2x /  week   OT Duration 8 weeks   OT Treatment/Interventions Self-care/ADL training;Therapeutic exercise;Ultrasound;DME and/or AE instruction;Manual Therapy;Passive range of motion;Therapeutic activities;Patient/family education;Therapeutic exercises;Energy conservation;Fluidtherapy;Splinting;Neuromuscular education;Parrafin;Moist Heat   Plan Issue HEP   Consulted and Agree with Plan of Care Patient        Problem List Patient Active Problem List   Diagnosis Date Noted  . Carpal tunnel syndrome 12/18/2014  . 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 Amenorrhea 10/06/2013  . Hypothyroidism 10/22/2012  . GERD 04/27/2010  . GOITER, DIFFUSE 08/05/2008    RINE,KATHRYN 12/24/2014, 4:50 PM Theone Murdoch, OTR/L [phone] Phone: [phone] 4:50 PM 04/28/2016[facility] [address] [place], [place], [address] Phone: [phone]   Fax:  [phone]

## 2014-12-25 ENCOUNTER — Encounter: Payer: Self-pay | Admitting: Occupational Therapy

## 2014-12-25 ENCOUNTER — Telehealth: Payer: Self-pay | Admitting: Family

## 2014-12-25 NOTE — Telephone Encounter (Signed)
Spoke with pt to advise that FMLA/STD forms that we received were the same exact forms therefore Padonda completed one set of them and they were faxed back. Advised that we may have to complete another set for her to attend PT twice a week. Pt will call [employer] to ensure that she doesn't need forms for STD and separate ones for FMLA. I also advised her that Abby Potash will not be back in the office until Thursday 12/31/14. Pt verbalized understanding

## 2014-12-25 NOTE — Telephone Encounter (Signed)
Pt called to ask if you would give her a call back. She said she started therapy yesterday.

## 2014-12-29 ENCOUNTER — Ambulatory Visit: Payer: 59 | Admitting: Occupational Therapy

## 2014-12-31 ENCOUNTER — Ambulatory Visit: Payer: 59 | Attending: Family | Admitting: Occupational Therapy

## 2014-12-31 DIAGNOSIS — M25641 Stiffness of right hand, not elsewhere classified: Secondary | ICD-10-CM | POA: Insufficient documentation

## 2014-12-31 DIAGNOSIS — M79641 Pain in right hand: Secondary | ICD-10-CM | POA: Insufficient documentation

## 2015-01-04 ENCOUNTER — Encounter: Payer: Self-pay | Admitting: Occupational Therapy

## 2015-01-04 ENCOUNTER — Ambulatory Visit: Payer: 59 | Admitting: Occupational Therapy

## 2015-01-04 ENCOUNTER — Ambulatory Visit: Payer: 59

## 2015-01-04 DIAGNOSIS — M79641 Pain in right hand: Secondary | ICD-10-CM | POA: Diagnosis not present

## 2015-01-04 DIAGNOSIS — M25641 Stiffness of right hand, not elsewhere classified: Secondary | ICD-10-CM | POA: Diagnosis not present

## 2015-01-04 NOTE — Therapy (Signed)
[facility] [address] [place], [place], [address] Phone: [phone]   Fax:  [phone]  Occupational Therapy Treatment  Patient Details  Name: Gwendolyn Scott MRN: [id number] Date of Birth: August 10, 1976 Referring Provider:  Kennyth Arnold, FNP  Encounter Date: 01/04/2015      OT End of Session - 01/04/15 1323    Visit Number 2   Number of Visits 17   Date for OT Re-Evaluation 02/21/15   Authorization Type UHC 60visit limit   OT Start Time 1230   OT Stop Time 1310   OT Time Calculation (min) 40 min   Activity Tolerance Patient limited by pain   Behavior During Therapy Anxious;Restless      Past Medical History  Diagnosis Date  . Grave's disease 2009    I-131    History reviewed. No pertinent past surgical history.  There were no vitals filed for this visit.  Visit Diagnosis:  Right hand pain  Joint stiffness of hand, right      Subjective Assessment - 01/04/15 1234    Subjective  "The pain is much worse since last Thursday" (Pt has pain radiating up arm and on dorsal side of forearm, c/o numbness on all fingers). Pt reports MRI scheduled for this Saturday   Limitations pain, numbness   Repetition Increases Symptoms   Currently in Pain? Yes   Pain Score 9    Pain Location Arm   Pain Orientation Right   Pain Descriptors / Indicators Aching;Throbbing;Radiating   Pain Type Chronic pain   Pain Onset More than a month ago   Pain Frequency Intermittent   Aggravating Factors  use   Pain Relieving Factors heat, pain meds                      OT Treatments/Exercises (OP) - 01/04/15 1244    Exercises   Exercises Wrist;Hand   Modalities   Modalities Moist Heat   Moist Heat Therapy   Number Minutes Moist Heat 12 Minutes   Moist Heat Location Wrist  and forearm (volar and dorsal)     Pt instructed to not apply heat longer than 15 minutes at a time and at least one full hour to two  hours before re-applying. Pt agreed. Pt given positions and activities to avoid (from CTS conservative management handout) to help decrease pain.  Pt shown upper and lower median n. gliding ex's and tendon gliding ex's, however pt unable to return demo of any ex's due to pain level. Pt could not participate/engage in therapy today.            OT Education - 01/04/15 1320    Education provided Yes   Education Details CTS conservative management handout, median n. gliding ex's, tendon gliding ex's   Person(s) Educated Patient   Methods Explanation;Demonstration;Handout   Comprehension Verbalized understanding  unable to return demo secondary to pain          OT Short Term Goals - 12/24/14 1525    OT SHORT TERM GOAL #1   Title I with inital HEP   Baseline check 01/22/15   Time 4   Period Weeks   Status New   OT SHORT TERM GOAL #2   Title Pt will demonstrate at least 75 % composite finger flexion in RUE for increased functional use.   Time 4   Period Weeks   Status New   OT SHORT TERM GOAL #3   Title Pt will use  RUE to assist with ADLS/ IADLS at least 23% of the time with pain no greater than 5 /10   Time 4   Period Weeks   Status New   OT SHORT TERM GOAL #4   Title Pt will report she is performing all basic ADLS independently.   Time 4   Period Weeks   Status New   OT SHORT TERM GOAL #5   Title Pt will verbalize understanding of adapted strategies for positioning/ ADLS to minimize pain and symptoms.   Time 4   Period Weeks   Status New           OT Long Term Goals - 12/24/14 1633    OT LONG TERM GOAL #1   Title Pt will demonstrate at least 90% composite flexion for increased functional use.   Baseline Check 02/21/15   Time 8   Period Weeks   Status New   OT LONG TERM GOAL #2   Title Pt will demonstrate RUE grip strength of at least 25 lbs for functional use.   Time 8   Period Weeks   Status New   OT LONG TERM GOAL #3   Title Pt will resume use of RUE as  dominant hand at least 75% of the time.   Time 8   Period Weeks   Status New   OT LONG TERM GOAL #4   Title Pt will improve her Quick DASH score to at least 40% impairment or less.   Time 8   Period Weeks   Status New               Plan - 01/04/15 1324    Clinical Impression Statement Pt with new c/o pain on dorsal side of forearm and radiating up entire arm. Pt unable to participate in any ex's shown to patient.    Rehab Potential Fair   Clinical Impairments Affecting Rehab Potential pt's decreased tolerance to pain   Plan assess ability to perform ex's, may place on hold until after MRI         Problem List Patient Active Problem List   Diagnosis Date Noted  . Carpal tunnel syndrome 12/18/2014  . 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 Amenorrhea 10/06/2013  . Hypothyroidism 10/22/2012  . GERD 04/27/2010  . GOITER, DIFFUSE 08/05/2008    Carey Bullocks, OTR/L 01/04/2015, 1:27 PM  [facility] [address] [place], [place], [address] Phone: [phone]   Fax:  [phone]

## 2015-01-06 ENCOUNTER — Encounter: Payer: 59 | Admitting: Occupational Therapy

## 2015-01-09 ENCOUNTER — Ambulatory Visit
Admission: RE | Admit: 2015-01-09 | Discharge: 2015-01-09 | Disposition: A | Discharge status: Home / Self Care | Payer: 59 | Source: Ambulatory Visit | Attending: Sports Medicine | Admitting: Sports Medicine

## 2015-01-09 DIAGNOSIS — M542 Cervicalgia: Secondary | ICD-10-CM

## 2015-01-12 ENCOUNTER — Ambulatory Visit: Payer: 59 | Admitting: Occupational Therapy

## 2015-01-12 DIAGNOSIS — M79641 Pain in right hand: Secondary | ICD-10-CM | POA: Diagnosis not present

## 2015-01-12 DIAGNOSIS — M25641 Stiffness of right hand, not elsewhere classified: Secondary | ICD-10-CM

## 2015-01-12 NOTE — Patient Instructions (Signed)
  Perform the following exercises:  Fingers straight, hook fist, roof, then gentle fist, repeat 10 reps 3x day with right hand  Gently bend wrist in flexion and extension, 10 reps 3x day. Stop if pain increases  Hold off on median n. Glides   Wear brace with repetative wrist movement or any lifting. Don't lift anything heavy!

## 2015-01-12 NOTE — Therapy (Signed)
[facility] [address] [place], [place], [address] Phone: [phone]   Fax:  [phone]  Occupational Therapy Treatment  Patient Details  Name: Gwendolyn Scott MRN: [id number] Date of Birth: 02/24/1976 Referring Provider:  Kennyth Arnold, FNP  Encounter Date: 01/12/2015      OT End of Session - 01/12/15 1139    Visit Number 3   Number of Visits 17   Date for OT Re-Evaluation 02/21/15   Authorization Type UHC 60visit limit   OT Start Time 0935   OT Stop Time 1015   OT Time Calculation (min) 40 min   Activity Tolerance Patient tolerated treatment well   Behavior During Therapy [facility] for tasks assessed/performed      Past Medical History  Diagnosis Date  . Grave's disease 2009    I-131    No past surgical history on file.  There were no vitals filed for this visit.  Visit Diagnosis:  Right hand pain  Joint stiffness of hand, right      Subjective Assessment - 01/12/15 0935    Subjective  Pain is much better since cortizone injection   Limitations pain   Currently in Pain? Yes   Pain Score 1    Pain Location Arm   Pain Orientation Right  dorsal arm   Pain Descriptors / Indicators Aching   Pain Type Chronic pain   Pain Onset More than a month ago   Pain Frequency Intermittent   Aggravating Factors  movement   Pain Relieving Factors heat, medicine   Effect of Pain on Daily Activities limitswork activities   Multiple Pain Sites No       Treatment: Hot pack applied to RUE x10 mins due to stiffness and pain, no adverse reactions. Pt was instructed in tendon gliding exercises and gentle wrist flexion extension, 10-20 reps each, min v.c./ demonstration. Therapist d/c median n. gliding due to pt. unable to tolerate. Ultrasound 3 MHx, 0.8 w/cm2, 20 % x 8 mins, no adverse reactions, pt reported decreased pain. Therapist recommended pt hold off on typing at this time, and performs gentle exercises  issued. Therapist recommends pt does not lift anything heavy and that she wears wrist brace during any repetetive motion.                       OT Education - 01/12/15 1136    Education provided Yes   Education Details modified HEP, see pt instructions   Person(s) Educated Patient   Methods Explanation;Demonstration;Handout   Comprehension Verbalized understanding;Returned demonstration          OT Short Term Goals - 12/24/14 1525    OT SHORT TERM GOAL #1   Title I with inital HEP   Baseline check 01/22/15   Time 4   Period Weeks   Status New   OT SHORT TERM GOAL #2   Title Pt will demonstrate at least 75 % composite finger flexion in RUE for increased functional use.   Time 4   Period Weeks   Status New   OT SHORT TERM GOAL #3   Title Pt will use RUE to assist with ADLS/ IADLS at least 32% of the time with pain no greater than 5 /10   Time 4   Period Weeks   Status New   OT SHORT TERM GOAL #4   Title Pt will report she is performing all basic ADLS independently.   Time 4   Period Weeks  Status New   OT SHORT TERM GOAL #5   Title Pt will verbalize understanding of adapted strategies for positioning/ ADLS to minimize pain and symptoms.   Time 4   Period Weeks   Status New           OT Long Term Goals - 12/24/14 1633    OT LONG TERM GOAL #1   Title Pt will demonstrate at least 90% composite flexion for increased functional use.   Baseline Check 02/21/15   Time 8   Period Weeks   Status New   OT LONG TERM GOAL #2   Title Pt will demonstrate RUE grip strength of at least 25 lbs for functional use.   Time 8   Period Weeks   Status New   OT LONG TERM GOAL #3   Title Pt will resume use of RUE as dominant hand at least 75% of the time.   Time 8   Period Weeks   Status New   OT LONG TERM GOAL #4   Title Pt will improve her Quick DASH score to at least 40% impairment or less.   Time 8   Period Weeks   Status New               Plan  - 01/12/15 1137    Clinical Impression Statement Pt is progressing towards goals with decreased pain since cortisone injection last week. Therapist to contact pt's orthopedist as pt is now reporting symptoms that are not consistent with CTS.   Pt will benefit from skilled therapeutic intervention in order to improve on the following deficits (Retired) Impaired flexibility;Pain;Impaired sensation;Decreased coordination;Decreased activity tolerance;Decreased endurance;Decreased range of motion;Decreased strength;Impaired UE functional use   Rehab Potential Fair   Clinical Impairments Affecting Rehab Potential pt's decreased tolerance to pain   OT Frequency 2x / week   OT Duration 8 weeks   OT Treatment/Interventions Self-care/ADL training;Therapeutic exercise;Ultrasound;DME and/or AE instruction;Manual Therapy;Passive range of motion;Therapeutic activities;Patient/family education;Therapeutic exercises;Energy conservation;Fluidtherapy;Splinting;Neuromuscular education;Parrafin;Moist Heat   Plan gentle A/ROM, modalities   Consulted and Agree with Plan of Care Patient        Problem List Patient Active Problem List   Diagnosis Date Noted  . Carpal tunnel syndrome 12/18/2014  . 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 Amenorrhea 10/06/2013  . Hypothyroidism 10/22/2012  . GERD 04/27/2010  . GOITER, DIFFUSE 08/05/2008    RINE,KATHRYN 01/12/2015, 11:41 AM Theone Murdoch, OTR/L [phone] Phone: [phone] 11:41 AM 01/12/2015 [facility] [address] [place], [place], [address] Phone: [phone]   Fax:  [phone]

## 2015-01-14 ENCOUNTER — Encounter: Payer: 59 | Admitting: Neurology

## 2015-01-14 ENCOUNTER — Ambulatory Visit: Payer: 59 | Admitting: Occupational Therapy

## 2015-01-19 ENCOUNTER — Telehealth: Payer: Self-pay | Admitting: Neurology

## 2015-01-19 ENCOUNTER — Ambulatory Visit: Payer: 59 | Admitting: Occupational Therapy

## 2015-01-19 ENCOUNTER — Encounter: Payer: Self-pay | Admitting: Neurology

## 2015-01-19 NOTE — Telephone Encounter (Signed)
Pt no showed 01/14/15 [facility] appt w/ Dr. Posey Pronto. No show letter mailed to pt. Referring provider office notified via [facility] referral  / Sherri S.

## 2015-01-20 ENCOUNTER — Other Ambulatory Visit: Payer: Self-pay | Admitting: Sports Medicine

## 2015-01-20 DIAGNOSIS — M542 Cervicalgia: Secondary | ICD-10-CM

## 2015-01-21 ENCOUNTER — Ambulatory Visit: Payer: 59 | Admitting: Occupational Therapy

## 2015-01-26 ENCOUNTER — Ambulatory Visit: Payer: 59 | Admitting: Occupational Therapy

## 2015-01-28 ENCOUNTER — Ambulatory Visit: Payer: 59 | Attending: Family | Admitting: Occupational Therapy

## 2015-02-02 ENCOUNTER — Ambulatory Visit: Payer: 59 | Admitting: Occupational Therapy

## 2015-02-02 ENCOUNTER — Telehealth: Payer: Self-pay | Admitting: *Deleted

## 2015-02-02 NOTE — Telephone Encounter (Signed)
Left message for pt to advise her to contact dentist office and/or go to ER for evaluation. Advised to call back with any questions or concerns

## 2015-02-02 NOTE — Telephone Encounter (Signed)
PLEASE NOTE: All timestamps contained within this report are represented as [place] Standard Time. CONFIDENTIALTY NOTICE: This fax transmission is intended only for the addressee. It contains information that is legally privileged, confidential or otherwise protected from use or disclosure. If you are not the intended recipient, you are strictly prohibited from reviewing, disclosing, copying using or disseminating any of this information or taking any action in reliance on or regarding this information. If you have received this fax in error, please notify us immediately by telephone so that we can arrange for its return to [place]. Phone: [phone], Toll-Free: [phone], Fax: [phone] Page: 1 of 2 Call Id: [id number] [facility] - Client [place] Patient Name: Gwendolyn Scott Gender: Female DOB: Dec 01, 1975 Age: 39 Y 9 M 23 D Return Phone Number: [phone] (Primary) Address: City/State/Zip: [place] Client [facility] - Client Client Site [facility] - Night Physician Roxy Cedar Contact Type Call Call Type Triage / Clinical Relationship To Patient Self Return Phone Number [phone] (Primary) Chief Complaint Tooth pain Initial Comment Caller states that she has a terrible toothache. Had a wisdom tooth come out on its own last Tuesday; has an appt to the dentist this Thursday. Taking Tylenol '500mg'$ , but still has swelling and pain. PreDisposition Go to ED Nurse Assessment Nurse: Mancel Bale, RN, [place] Date/Time Eilene Ghazi Time): 02/01/2015 8:40:22 PM Confirm and document reason for call. If symptomatic, describe symptoms. ---Caller states that she has a terrible toothache. Had a wisdom tooth come out on its own last Tuesday; has an appt to the dentist this Thursday. Taking Tylenol '500mg'$ , but still has swelling and pain. Has the patient traveled out of the country  within the last 30 days? ---No Does the patient require triage? ---Yes Related visit to physician within the last 2 weeks? ---No Does the PT have any chronic conditions? (i.e. diabetes, asthma, etc.) ---Yes List chronic conditions. ---Thyroid Did the patient indicate they were pregnant? ---No Guidelines Guideline Title Affirmed Question Affirmed Notes Nurse Date/Time ([place] Time) Toothache [1] Face is swollen AND [2] no fever Mancel Bale, RN, Cayla 02/01/2015 8:41:55 PM Disp. Time Eilene Ghazi Time) Disposition Final User 02/01/2015 8:47:49 PM See Dentist within 24 Hours Yes Mancel Bale, RN, [employer] Caller Understands: Yes Disagree/Comply: Comply PLEASE NOTE: All timestamps contained within this report are represented as [place] Standard Time. CONFIDENTIALTY NOTICE: This fax transmission is intended only for the addressee. It contains information that is legally privileged, confidential or otherwise protected from use or disclosure. If you are not the intended recipient, you are strictly prohibited from reviewing, disclosing, copying using or disseminating any of this information or taking any action in reliance on or regarding this information. If you have received this fax in error, please notify us immediately by telephone so that we can arrange for its return to [place]. Phone: [phone], Toll-Free: [phone], Fax: [phone] Page: 2 of 2 Call Id: [id number] Care Advice Given Per Guideline SEE DENTIST WITHIN 24 HOURS: You need to be examined by a dentist within the next 24 hours. If the office will be open tomorrow, call then. On weekends, call the dental office for on-call options. If you don't have a dentist, see a doctor within the next 24 hours. ALTERNATE DISPOSITION: If a dentist is unavailable, a physician can help by prescribing penicillin which will kill the bacteria and relieve the pressure within the abscess. PAIN MEDICINES: * Take 650 mg (two 325 mg pills) by mouth every 4-6 hours as  needed. Each Regular Strength Tylenol pill has 325 mg of acetaminophen. The most you should take each day is 3,250 mg (10 Regular Strength pills a day). * Another choice is to take 1,000 mg (two 500 mg pills) every 8 hours as needed. Each Extra Strength Tylenol pill has 500 mg of acetaminophen. The most you should take each day is 3,000 mg (6 Extra Strength pills a day). * Take 400 mg (two 200 mg pills) by mouth every 6 hours as needed. IBUPROFEN (E.G., MOTRIN, ADVIL): LOCAL COLD: Apply a cold pack or ice in a wet washcloth to the painful area of the face for 20 minutes. CALL BACK IF: * You become worse. CARE ADVICE given per Toothache (Adult) guideline. After Care Instructions Given Call Event Type User Date / Time Description Referrals REFERRED TO PCP OFFICE

## 2015-02-04 ENCOUNTER — Telehealth: Payer: Self-pay | Admitting: Internal Medicine

## 2015-02-04 ENCOUNTER — Ambulatory Visit: Payer: 59 | Admitting: Occupational Therapy

## 2015-02-04 NOTE — Telephone Encounter (Signed)
Pt states she is having problems with reflux. Pt states she is not taking omeprazole '40mg'$  daily anymore. Discussed with pt that she can get this OTC and resume the prilosec. Pt will call if this does not help.

## 2015-02-05 ENCOUNTER — Other Ambulatory Visit: Payer: 59

## 2015-02-08 ENCOUNTER — Ambulatory Visit (INDEPENDENT_AMBULATORY_CARE_PROVIDER_SITE_OTHER): Payer: 59 | Admitting: Family Medicine

## 2015-02-08 ENCOUNTER — Telehealth: Payer: Self-pay | Admitting: Family

## 2015-02-08 ENCOUNTER — Encounter: Payer: Self-pay | Admitting: Family Medicine

## 2015-02-08 VITALS — BP 108/88 | HR 68 | Temp 98.7°F | Wt 241.0 lb

## 2015-02-08 DIAGNOSIS — K219 Gastro-esophageal reflux disease without esophagitis: Secondary | ICD-10-CM

## 2015-02-08 DIAGNOSIS — R0982 Postnasal drip: Secondary | ICD-10-CM | POA: Diagnosis not present

## 2015-02-08 NOTE — Patient Instructions (Signed)
Get back on Omeprazole one daily Recommend OTC Allegra or Zyrtec and Nasacort or Flonase

## 2015-02-08 NOTE — Telephone Encounter (Signed)
Noted  

## 2015-02-08 NOTE — Progress Notes (Signed)
   Subjective:    Patient ID: Gwendolyn Scott, female    DOB: 11-21-1975, 39 y.o.   MRN: [id number]  HPI Patient is seen with one-week history of feeling like "something is stuck in her throat ".  She denies any dysphagia or any pain with swallowing. No recent choking sensation. She has had some nasal congestion and postnasal drip symptoms. Frequent sneezing. She does have a long history of reflux currently not taking anything for that. She has frequent heartburn symptoms. Previously has taken Prilosec which has worked well for her. No recent change of voice.  Past Medical History  Diagnosis Date  . Grave's disease 2009    I-131   No past surgical history on file.  reports that she has never smoked. She has never used smokeless tobacco. She reports that she does not drink alcohol or use illicit drugs. family history includes Diabetes in her maternal aunt; Hypothyroidism in her maternal grandmother and mother. There is no history of Colon cancer. Allergies  Allergen Reactions  . Penicillins Other (See Comments)    Unknown reaction      Review of Systems  Constitutional: Negative for fever, chills, appetite change and unexpected weight change.  HENT: Negative for sore throat, trouble swallowing and voice change.   Respiratory: Negative for shortness of breath.   Cardiovascular: Negative for chest pain.  Gastrointestinal: Negative for abdominal pain.       Objective:   Physical Exam  Constitutional: She appears well-developed and well-nourished.  HENT:  Cerumen impactions bilaterally. Oropharynx clear  Neck: Neck supple.  Cardiovascular: Normal rate and regular rhythm.   Pulmonary/Chest: Effort normal and breath sounds normal. No respiratory distress. She has no wheezes. She has no rales.  Lymphadenopathy:    She has no cervical adenopathy.          Assessment & Plan:  Postnasal drip symptoms. Patient describing increased mucus in her upper airway. Possibly related to  allergic postnasal drip and possible component of GERD. Get back on Prilosec 40 mg once daily. Over-the-counter Zyrtec or Allegra. Consider addition of Flonase or Nasacort. Touch base if not improving with the above

## 2015-02-08 NOTE — Progress Notes (Signed)
Pre visit review using our clinic review tool, if applicable. No additional management support is needed unless otherwise documented below in the visit note. 

## 2015-02-08 NOTE — Telephone Encounter (Signed)
PA for omeprazole was denied.  I will send a copy of the denial letter to you for review.

## 2015-02-09 ENCOUNTER — Encounter: Payer: 59 | Admitting: Neurology

## 2015-02-09 ENCOUNTER — Ambulatory Visit: Payer: 59 | Admitting: Occupational Therapy

## 2015-02-10 ENCOUNTER — Encounter: Payer: Self-pay | Admitting: Neurology

## 2015-02-11 ENCOUNTER — Encounter: Payer: 59 | Admitting: Occupational Therapy

## 2015-02-15 ENCOUNTER — Encounter: Payer: 59 | Admitting: Occupational Therapy

## 2015-02-18 ENCOUNTER — Encounter: Payer: 59 | Admitting: Occupational Therapy

## 2015-03-05 ENCOUNTER — Telehealth: Payer: Self-pay | Admitting: Endocrinology

## 2015-03-05 ENCOUNTER — Other Ambulatory Visit: Payer: Self-pay

## 2015-03-05 MED ORDER — LEVOTHYROXINE SODIUM 75 MCG PO TABS: 75.0000 ug | ORAL_TABLET | Freq: Every day | ORAL | Status: DC

## 2015-03-05 NOTE — Telephone Encounter (Signed)
Patient called and would like a refill on her Rx   Rx: Levothyroxine 90 day supply    Pharmacy: [facility] [address]    Thank you

## 2015-03-15 ENCOUNTER — Other Ambulatory Visit: Payer: Self-pay

## 2015-03-15 ENCOUNTER — Other Ambulatory Visit: Payer: Self-pay | Admitting: Family Medicine

## 2015-03-15 MED ORDER — OMEPRAZOLE 40 MG PO CPDR: 40.0000 mg | DELAYED_RELEASE_CAPSULE | Freq: Every day | ORAL | Status: DC

## 2015-03-15 MED ORDER — LEVOTHYROXINE SODIUM 75 MCG PO TABS: 75.0000 ug | ORAL_TABLET | Freq: Every day | ORAL | Status: DC

## 2015-03-15 NOTE — Telephone Encounter (Signed)
Sent to the pharmacy by e-scribe for 90 days.  Last OV note does not state when to return.

## 2015-03-18 ENCOUNTER — Other Ambulatory Visit: Payer: Self-pay | Admitting: *Deleted

## 2015-03-18 MED ORDER — OMEPRAZOLE 40 MG PO CPDR: 40.0000 mg | DELAYED_RELEASE_CAPSULE | Freq: Every day | ORAL | Status: DC

## 2015-04-12 ENCOUNTER — Telehealth: Payer: Self-pay | Admitting: Family

## 2015-04-12 NOTE — Telephone Encounter (Signed)
1. Pt would like to know if it is ok for her to bring her depo and have [place] give her the injection? Pt used to get at her OBGYN, but her OB has retired.  2.  Pt states she was told she could stay w/ padonda, no more than she comes in. Pt refused to make an appt w/ a new pcp. No one else here she wants to see. pls advise

## 2015-04-13 NOTE — Telephone Encounter (Signed)
Yes, she can supply her own Depo medication. Will likely request a urine pregnancy for initial visit. Will call patient to update about information and updated about establishing with another provider.

## 2015-04-14 ENCOUNTER — Ambulatory Visit (INDEPENDENT_AMBULATORY_CARE_PROVIDER_SITE_OTHER): Payer: Commercial Managed Care - PPO | Admitting: Family

## 2015-04-14 ENCOUNTER — Encounter: Payer: Self-pay | Admitting: Family

## 2015-04-14 VITALS — BP 110/80 | HR 88 | Temp 98.3°F | Wt 235.0 lb

## 2015-04-14 DIAGNOSIS — K2100 Gastro-esophageal reflux disease with esophagitis, without bleeding: Secondary | ICD-10-CM

## 2015-04-14 DIAGNOSIS — Z30018 Encounter for initial prescription of other contraceptives: Secondary | ICD-10-CM

## 2015-04-14 DIAGNOSIS — E039 Hypothyroidism, unspecified: Secondary | ICD-10-CM

## 2015-04-14 DIAGNOSIS — E669 Obesity, unspecified: Secondary | ICD-10-CM

## 2015-04-14 MED ORDER — MEDROXYPROGESTERONE ACETATE 150 MG/ML IM SUSP
150.0000 mg | Freq: Once | INTRAMUSCULAR | Status: AC
  Administered 2015-04-14: 150 mg via INTRAMUSCULAR

## 2015-04-14 NOTE — Progress Notes (Signed)
Pre visit review using our clinic review tool, if applicable. No additional management support is needed unless otherwise documented below in the visit note. 

## 2015-04-14 NOTE — Progress Notes (Signed)
Subjective:    Patient ID: Gwendolyn Scott, female    DOB: March 02, 1976, 39 y.o.   MRN: [id number]  HPI   39 year old African-American female, nonsmoker with a history of hypothyroidism is in today to have a Depo-Provera administered [name] recheck of hypothyroidism. She also has GERD that is stable on omeprazole. Last Pap smear was April 2016 and she's been seeing gynecology prescribed the medication. She gets her prescription for mail order.  Review of Systems  Constitutional: Negative.   HENT: Negative.   Respiratory: Negative.   Cardiovascular: Negative.   Gastrointestinal: Negative.   Endocrine: Negative.   Genitourinary: Negative.   Musculoskeletal: Negative.   Allergic/Immunologic: Negative.   Neurological: Negative.   Psychiatric/Behavioral: Negative.    Past Medical History  Diagnosis Date  . Grave's disease 2009    I-131    Social History   Social History  . Marital Status: Divorced    Spouse Name: N/A  . Number of Children: 0  . Years of Education: N/A   Occupational History  . customer service    Social History Main Topics  . Smoking status: Never Smoker   . Smokeless tobacco: Never Used  . Alcohol Use: No  . Drug Use: No  . Sexual Activity: Not on file   Other Topics Concern  . Not on file   Social History Narrative    History reviewed. No pertinent past surgical history.  Family History  Problem Relation Age of Onset  . Hypothyroidism Mother   . Hypothyroidism Maternal Grandmother   . Diabetes Maternal Aunt   . Colon cancer Neg Hx     Allergies  Allergen Reactions  . Penicillins Other (See Comments)    Unknown reaction    Current Outpatient Prescriptions on File Prior to Visit  Medication Sig Dispense Refill  . acetaminophen (TYLENOL) 500 MG tablet Take 500 mg by mouth every 6 (six) hours as needed.      . diclofenac (VOLTAREN) 75 MG EC tablet Take 1 tablet (75 mg total) by mouth 2 (two) times daily. 60 tablet 0  . levothyroxine  (SYNTHROID, LEVOTHROID) 75 MCG tablet Take 1 tablet (75 mcg total) by mouth daily before breakfast. 90 tablet 1  . medroxyPROGESTERone (DEPO-PROVERA) 150 MG/ML injection Inject 150 mg as directed Every 3 months.    [name] omeprazole (PRILOSEC) 40 MG capsule Take 1 capsule (40 mg total) by mouth daily. 90 capsule 0  . traMADol (ULTRAM) 50 MG tablet Take 1 tablet (50 mg total) by mouth 3 (three) times daily as needed (pain). 30 tablet 0   No current facility-administered medications on file prior to visit.    BP 110/80 mmHg  Pulse 88  Temp(Src) 98.3 F (36.8 C) (Oral)  Wt 235 lb (106.595 kg)chart     Objective:   Physical Exam  Constitutional: She is oriented to person, place, and time. She appears well-developed and well-nourished.  HENT:  Right Ear: External ear normal.  Left Ear: External ear normal.  Nose: Nose normal.  Mouth/Throat: Oropharynx is clear and moist.  Neck: Normal range of motion. Neck supple. No thyromegaly present.  Cardiovascular: Normal rate, regular rhythm and normal heart sounds.   Pulmonary/Chest: Effort normal and breath sounds normal.  Abdominal: Soft. Bowel sounds are normal.  Musculoskeletal: Normal range of motion.  Neurological: She is alert and oriented to person, place, and time.  Skin: Skin is warm and dry.  Psychiatric: She has a normal mood and affect.  Assessment & Plan:  Gwendolyn Scott was seen today for follow-up.  Diagnoses and all orders for this visit:  Hypothyroidism, unspecified hypothyroidism type -     TSH  Obesity  Gastroesophageal reflux disease with esophagitis  Encounter for initial prescription of other contraceptives -     medroxyPROGESTERone (DEPO-PROVERA) injection 150 mg; Inject 1 mL (150 mg total) into the muscle once.     Labs obtained.call the office with any questions or concerns. Recheck in 3 months for next step AND SOONER AS NEEDED.

## 2015-04-14 NOTE — Patient Instructions (Signed)

## 2015-05-21 ENCOUNTER — Other Ambulatory Visit: Payer: Self-pay | Admitting: Family

## 2015-05-24 ENCOUNTER — Ambulatory Visit: Payer: 59 | Admitting: Endocrinology

## 2015-05-31 ENCOUNTER — Encounter: Payer: Self-pay | Admitting: Adult Health

## 2015-05-31 ENCOUNTER — Ambulatory Visit (HOSPITAL_COMMUNITY)
Admission: RE | Admit: 2015-05-31 | Discharge: 2015-05-31 | Disposition: A | Discharge status: Home / Self Care | Payer: Commercial Managed Care - PPO | Source: Ambulatory Visit | Attending: Adult Health | Admitting: Adult Health

## 2015-05-31 ENCOUNTER — Ambulatory Visit (INDEPENDENT_AMBULATORY_CARE_PROVIDER_SITE_OTHER): Payer: Commercial Managed Care - PPO | Admitting: Adult Health

## 2015-05-31 VITALS — Temp 98.3°F | Ht 61.0 in | Wt 239.9 lb

## 2015-05-31 DIAGNOSIS — M76892 Other specified enthesopathies of left lower limb, excluding foot: Secondary | ICD-10-CM | POA: Insufficient documentation

## 2015-05-31 DIAGNOSIS — M25562 Pain in left knee: Secondary | ICD-10-CM

## 2015-05-31 DIAGNOSIS — M179 Osteoarthritis of knee, unspecified: Secondary | ICD-10-CM | POA: Diagnosis not present

## 2015-05-31 NOTE — Progress Notes (Signed)
Pre visit review using our clinic review tool, if applicable. No additional management support is needed unless otherwise documented below in the visit note. 

## 2015-05-31 NOTE — Patient Instructions (Addendum)
It was great meeting you today!  I will follow up with you regarding your x ray. Continue with the home therapy you have been doing.   Tendinitis Tendinitis is swelling and inflammation of the tendons. Tendons are band-like tissues that connect muscle to bone. Tendinitis commonly occurs in the:   Shoulders (rotator cuff).  Heels (Achilles tendon).  Elbows (triceps tendon). CAUSES Tendinitis is usually caused by overusing the tendon, muscles, and joints involved. When the tissue surrounding a tendon (synovium) becomes inflamed, it is called tenosynovitis. Tendinitis commonly develops in people whose jobs require repetitive motions. SYMPTOMS  Pain.  Tenderness.  Mild swelling. DIAGNOSIS Tendinitis is usually diagnosed by physical exam. Your health care provider may also order X-rays or other imaging tests. TREATMENT Your health care provider may recommend certain medicines or exercises for your treatment. HOME CARE INSTRUCTIONS   Use a sling or splint for as long as directed by your health care provider until the pain decreases.  Put ice on the injured area.  Put ice in a plastic bag.  Place a towel between your skin and the bag.  Leave the ice on for 15-20 minutes, 3-4 times a day, or as directed by your health care provider.  Avoid using the limb while the tendon is painful. Perform gentle range of motion exercises only as directed by your health care provider. Stop exercises if pain or discomfort increase, unless directed otherwise by your health care provider.  Only take over-the-counter or prescription medicines for pain, discomfort, or fever as directed by your health care provider. SEEK MEDICAL CARE IF:   Your pain and swelling increase.  You develop new, unexplained symptoms, especially increased numbness in the hands. MAKE SURE YOU:   Understand these instructions.  Will watch your condition.  Will get help right away if you are not doing well or get  worse. Document Released: 08/11/2000 Document Revised: 12/29/2013 Document Reviewed: 10/31/2010 [facility] Patient Information 2015 [place], [place]. This information is not intended to replace advice given to you by your health care provider. Make sure you discuss any questions you have with your health care provider.

## 2015-05-31 NOTE — Progress Notes (Signed)
Subjective:    Patient ID: Gwendolyn Scott, female    DOB: 05/13/1976, 39 y.o.   MRN: [id number]  HPI  39 year old female who presents to the office today for an acute issue of knee pain with standing or putting pressure on it. This first started two or three weeks ago. She states " I can feel a pop or click in it when I bend it" She has been using Tylenol '500mg'$  as needed and OTC icy hot as well as ice and heat - which she endorses helps out a lot.   She does not like to take Ibuprofen due to hypothyroidism. She denies any swelling, pain or numbness down her leg or trauma to the area.   .     Review of Systems  Unable to perform ROS Constitutional: Negative.   Respiratory: Negative.   Cardiovascular: Negative.   Musculoskeletal: Positive for myalgias and arthralgias. Negative for joint swelling.  Neurological: Negative.   All other systems reviewed and are negative.  Past Medical History  Diagnosis Date  . Grave's disease 2009    I-131    Social History   Social History  . Marital Status: Divorced    Spouse Name: N/A  . Number of Children: 0  . Years of Education: N/A   Occupational History  . customer service    Social History Main Topics  . Smoking status: Never Smoker   . Smokeless tobacco: Never Used  . Alcohol Use: No  . Drug Use: No  . Sexual Activity: Not on file   Other Topics Concern  . Not on file   Social History Narrative    No past surgical history on file.  Family History  Problem Relation Age of Onset  . Hypothyroidism Mother   . Hypothyroidism Maternal Grandmother   . Diabetes Maternal Aunt   . Colon cancer Neg Hx     Allergies  Allergen Reactions  . Penicillins Other (See Comments)    Unknown reaction    Current Outpatient Prescriptions on File Prior to Visit  Medication Sig Dispense Refill  . acetaminophen (TYLENOL) 500 MG tablet Take 500 mg by mouth every 6 (six) hours as needed.      [name] levothyroxine (SYNTHROID, LEVOTHROID) 75  MCG tablet Take 1 tablet (75 mcg total) by mouth daily before breakfast. 90 tablet 1  . medroxyPROGESTERone (DEPO-PROVERA) 150 MG/ML injection Inject 150 mg as directed Every 3 months.    [name] omeprazole (PRILOSEC) 40 MG capsule Take 1 capsule by mouth  daily at least 30 minutes  prior to first meal of the  day 90 capsule 0  . diclofenac (VOLTAREN) 75 MG EC tablet Take 1 tablet (75 mg total) by mouth 2 (two) times daily. (Patient not taking: Reported on 05/31/2015) 60 tablet 0  . traMADol (ULTRAM) 50 MG tablet Take 1 tablet (50 mg total) by mouth 3 (three) times daily as needed (pain). (Patient not taking: Reported on 05/31/2015) 30 tablet 0   No current facility-administered medications on file prior to visit.    Temp(Src) 98.3 F (36.8 C) (Oral)  Ht '5\' 1"'$  (1.549 m)  Wt 239 lb 14.4 oz (108.818 kg)  BMI 45.35 kg/m2       Objective:   Physical Exam  Constitutional: She is oriented to person, place, and time. She appears well-developed and well-nourished. No distress.  Musculoskeletal: She exhibits tenderness. She exhibits no edema.  Tenderness with palpation to left lower knee.  Slight pain when bringing knee to  chest. No pain with straight leg raise.   Neurological: She is alert and oriented to person, place, and time.  Skin: Skin is warm and dry. No rash noted. She is not diaphoretic. No erythema. No pallor.  Psychiatric: She has a normal mood and affect. Her behavior is normal. Judgment and thought content normal.  Nursing note and vitals reviewed.      Assessment & Plan:  1. Left knee pain - Likely MSK or tendonitis in nature - DG Knee 1-2 Views Left; Future - Ace bandaged provided - Can take ibuprofen for 3-5 days.  - ice and elevation - Rest - Follow up if no improvement.  - Consider injection

## 2015-06-15 ENCOUNTER — Ambulatory Visit: Payer: Commercial Managed Care - PPO | Admitting: Endocrinology

## 2015-06-15 ENCOUNTER — Telehealth: Payer: Self-pay | Admitting: Endocrinology

## 2015-06-15 NOTE — Telephone Encounter (Signed)
Patient Name: Gwendolyn Scott Gender: Female DOB: [birth year] Age: 39 Y 2 M 3 D Return Phone Number: Address: City/State/Zip: [place] [employer] Endocrinology Night - Client Client Site [place] [facility] Physician Renato Shin Contact Type Call [place] Name same [place] Phone Number n/a Relationship To Patient Self Is this call to report lab results? No Call Type General Information Initial Comment caller states she needs to cancel her 7:45am appt today General Information Type Appointment  Cancelled patient appointment for today

## 2015-07-02 ENCOUNTER — Encounter: Payer: Self-pay | Admitting: Endocrinology

## 2015-07-02 ENCOUNTER — Ambulatory Visit (INDEPENDENT_AMBULATORY_CARE_PROVIDER_SITE_OTHER): Payer: Commercial Managed Care - PPO | Admitting: Endocrinology

## 2015-07-02 VITALS — BP 134/87 | HR 89 | Temp 98.6°F | Ht 61.0 in | Wt 244.0 lb

## 2015-07-02 DIAGNOSIS — E890 Postprocedural hypothyroidism: Secondary | ICD-10-CM | POA: Diagnosis not present

## 2015-07-02 LAB — TSH: TSH: 1.65 u[IU]/mL (ref 0.35–4.50)

## 2015-07-02 LAB — T4, FREE: FREE T4: 1.05 ng/dL (ref 0.60–1.60)

## 2015-07-02 NOTE — Progress Notes (Signed)
   Subjective:    Patient ID: Gwendolyn Scott, female    DOB: 1976-07-13, 38 y.o.   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  She takes depo-provera (Dr Harrington Challenger).  pt states she feels well in general.   Past Medical History  Diagnosis Date  . Grave's disease 2009    I-131    No past surgical history on file.  Social History   Social History  . Marital Status: Divorced    Spouse Name: N/A  . Number of Children: 0  . Years of Education: N/A   Occupational History  . customer service    Social History Main Topics  . Smoking status: Never Smoker   . Smokeless tobacco: Never Used  . Alcohol Use: No  . Drug Use: No  . Sexual Activity: Not on file   Other Topics Concern  . Not on file   Social History Narrative    Current Outpatient Prescriptions on File Prior to Visit  Medication Sig Dispense Refill  . acetaminophen (TYLENOL) 500 MG tablet Take 500 mg by mouth every 6 (six) hours as needed.      [name] levothyroxine (SYNTHROID, LEVOTHROID) 75 MCG tablet Take 1 tablet (75 mcg total) by mouth daily before breakfast. 90 tablet 1  . medroxyPROGESTERone (DEPO-PROVERA) 150 MG/ML injection Inject 150 mg as directed Every 3 months.    [name] omeprazole (PRILOSEC) 40 MG capsule Take 1 capsule by mouth  daily at least 30 minutes  prior to first meal of the  day 90 capsule 0  . traMADol (ULTRAM) 50 MG tablet Take 1 tablet (50 mg total) by mouth 3 (three) times daily as needed (pain). 30 tablet 0  . diclofenac (VOLTAREN) 75 MG EC tablet Take 1 tablet (75 mg total) by mouth 2 (two) times daily. (Patient not taking: Reported on 07/02/2015) 60 tablet 0   No  current facility-administered medications on file prior to visit.    Allergies  Allergen Reactions  . Penicillins Other (See Comments)    Unknown reaction    Family History  Problem Relation Age of Onset  . Hypothyroidism Mother   . Hypothyroidism Maternal Grandmother   . Diabetes Maternal Aunt   . Colon cancer Neg Hx     BP 134/87 mmHg  Pulse 89  Temp(Src) 98.6 F (37 C) (Oral)  Ht '5\' 1"'$  (1.549 m)  Wt 244 lb (110.678 kg)  BMI 46.13 kg/m2  SpO2 96%  Review of Systems No weight change    Objective:   Physical Exam VITAL SIGNS:  See vs page GENERAL: no distress NECK: There is no palpable thyroid enlargement.  No thyroid nodule is palpable.  No palpable lymphadenopathy at the anterior neck.   Lab Results  Component Value Date   TSH 1.65 07/02/2015   T3TOTAL 91.8 10/22/2014   T4TOTAL 8.0 10/22/2014      Assessment & Plan:  Post-I-131 hypothyroidism, well-replaced.  Please continue the same medication.   Please come back for a follow-up appointment in 6 months.

## 2015-07-02 NOTE — Patient Instructions (Addendum)
blood tests are requested for you today.  We'll let you know about the results.  Your thyroid level is normal on less than a full daily amount of thyroid medication.  This means that your thyroid was not completely destroyed by the radioactive iodine.  This in turn means that your medication need will increase or decrease over time, but is unlikely to stay the same.  Please come back for a follow-up appointment in 6 months.

## 2015-07-14 ENCOUNTER — Encounter: Payer: Commercial Managed Care - PPO | Admitting: Obstetrics & Gynecology

## 2015-07-15 ENCOUNTER — Encounter: Payer: Self-pay | Admitting: Adult Health

## 2015-07-15 ENCOUNTER — Ambulatory Visit (INDEPENDENT_AMBULATORY_CARE_PROVIDER_SITE_OTHER): Payer: Commercial Managed Care - PPO | Admitting: Adult Health

## 2015-07-15 VITALS — BP 104/80 | Temp 98.1°F | Ht 61.0 in | Wt 240.6 lb

## 2015-07-15 DIAGNOSIS — E669 Obesity, unspecified: Secondary | ICD-10-CM

## 2015-07-15 DIAGNOSIS — Z308 Encounter for other contraceptive management: Secondary | ICD-10-CM

## 2015-07-15 DIAGNOSIS — Z7189 Other specified counseling: Secondary | ICD-10-CM | POA: Diagnosis not present

## 2015-07-15 DIAGNOSIS — Z7689 Persons encountering health services in other specified circumstances: Secondary | ICD-10-CM

## 2015-07-15 MED ORDER — MEDROXYPROGESTERONE ACETATE 150 MG/ML IM SUSP
150.0000 mg | Freq: Once | INTRAMUSCULAR | Status: AC
  Administered 2015-07-15: 150 mg via INTRAMUSCULAR

## 2015-07-15 MED ORDER — LORCASERIN HCL 10 MG PO TABS: 10.0000 mg | ORAL_TABLET | Freq: Two times a day (BID) | ORAL | Status: DC

## 2015-07-15 NOTE — Patient Instructions (Signed)
It was great meeting you today!  Please follow up for your physical.   Continue to work on diet and exercise. Take the [place] twice a day. At the end of the month, call Alisha and let her know what your weight is.   If you need anything in the meantime, please let me know.

## 2015-07-15 NOTE — Progress Notes (Signed)
HPI:  Gwendolyn Scott is here to establish care. She is a very pleasant AA non smoking female. She  has a past medical history of Grave's disease (2009) and GERD (gastroesophageal reflux disease).  Last PCP and physical: 07/2014 with NP Justin Mend Immunizations: UTD Diet: She is working on eating better  Exercise:She is working on exercise Pap Smear: April 2015 - no abnormal  Mammogram: Needs to make an appointment   Is followed by Endocrinology for thyroid [facility]     Has the following chronic problems that require follow up and concerns today:  Obesity  - She reports that she has made some changes in her diet and is starting to exercise. She has lost 4 pounds this month. Has not tried any medications to help her lose weight in the past but would be interested in trying.   Hypothyroidism - Family history. Is currently taking 75 mcg Synthroid.  Lab Results  Component Value Date   TSH 1.65 07/02/2015     ROS negative for unless reported above: fevers, chills,feeling poorly, unintentional weight loss, hearing or vision loss, chest pain, palpitations, leg claudication, struggling to breath,Not feeling congested in the chest, no orthopenia, no cough,no wheezing, normal appetite, no soft tissue swelling, no hemoptysis, melena, hematochezia, hematuria, falls, loc, si, or thoughts of self harm.   Past Medical History  Diagnosis Date  . Grave's disease 2009    I-131    No past surgical history on file.  Family History  Problem Relation Age of Onset  . Hypothyroidism Mother   . Hypothyroidism Maternal Grandmother   . Diabetes Maternal Aunt   . Colon cancer Neg Hx     Social History   Social History  . Marital Status: Divorced    Spouse Name: N/A  . Number of Children: 0  . Years of Education: N/A   Occupational History  . customer service    Social History Main Topics  . Smoking status: Never Smoker   . Smokeless tobacco: Never Used  . Alcohol Use: No   . Drug Use: No  . Sexual Activity: Not Asked   Other Topics Concern  . None   Social History Narrative     Current outpatient prescriptions:  .  acetaminophen (TYLENOL) 500 MG tablet, Take 500 mg by mouth every 6 (six) hours as needed.  , Disp: , Rfl:  .  levothyroxine (SYNTHROID, LEVOTHROID) 75 MCG tablet, Take 1 tablet (75 mcg total) by mouth daily before breakfast., Disp: 90 tablet, Rfl: 1 .  medroxyPROGESTERone (DEPO-PROVERA) 150 MG/ML injection, Inject 150 mg as directed Every 3 months., Disp: , Rfl:  .  omeprazole (PRILOSEC) 40 MG capsule, Take 1 capsule by mouth  daily at least 30 minutes  prior to first meal of the  day, Disp: 90 capsule, Rfl: 0  EXAM:  Filed Vitals:   07/15/15 0957  BP: 104/80  Temp: 98.1 F (36.7 C)    Body mass index is 45.48 kg/(m^2).  GENERAL: vitals reviewed and listed above, alert, oriented, appears well hydrated and in no acute distress. Obese  HEENT: atraumatic, conjunttiva clear, no obvious abnormalities on inspection of external nose and ears  NECK: Neck is soft and supple without masses, no adenopathy or thyromegaly, trachea midline, no JVD. Normal range of motion.   LUNGS: clear to auscultation bilaterally, no wheezes, rales or rhonchi, good air movement  CV: Regular rate and rhythm, normal S1/S2, no audible murmurs, gallops, or rubs. No carotid bruit and no peripheral  edema.   MS: moves all extremities without noticeable abnormality. No edema noted  Abd: soft/nontender/nondistended/normal bowel sounds   Skin: warm and dry, no rash   Extremities: No clubbing, cyanosis, or edema. Capillary refill is WNL. Pulses intact bilaterally in upper and lower extremities.   Neuro: CN II-XII intact, sensation and reflexes normal throughout, 5/5 muscle strength in bilateral upper and lower extremities. Normal finger to nose. Normal rapid alternating movements.  PSYCH: pleasant and cooperative, no obvious depression or anxiety  ASSESSMENT  AND PLAN:  1. Encounter to establish care Is due for CPE in December - Follow up sooner if needed   2. Encounter for other contraceptive management medroxyPROGESTERone (DEPO-PROVERA) injection 150 mg; Inject 1 mL (150 mg total) into the muscle once.  3. Obesity - Lorcaserin HCl 10 MG TABS; Take 10 mg by mouth 2 (two) times daily.  Dispense: 60 tablet; Refill: 0 - Stressed the importance that the medication was not a substitute for diet and exercise. She will call back monthly and inform us of her weight.    -We reviewed the PMH, PSH, FH, SH, Meds and Allergies. -We provided refills for any medications we will prescribe as needed. -We addressed current concerns per orders and patient instructions. -We have asked for records for pertinent exams, studies, vaccines and notes from previous providers. -We have advised patient to follow up per instructions below.   -Patient advised to return or notify a provider immediately if symptoms worsen or persist or new concerns arise.  There are no Patient Instructions on file for this visit.   Dorothyann Peng, AGNP

## 2015-07-20 ENCOUNTER — Other Ambulatory Visit: Payer: Self-pay | Admitting: Endocrinology

## 2015-07-28 ENCOUNTER — Ambulatory Visit (INDEPENDENT_AMBULATORY_CARE_PROVIDER_SITE_OTHER): Payer: Commercial Managed Care - PPO | Admitting: Adult Health

## 2015-07-28 ENCOUNTER — Encounter: Payer: Self-pay | Admitting: Adult Health

## 2015-07-28 VITALS — BP 118/80 | HR 87 | Temp 98.6°F | Ht 61.0 in | Wt 237.7 lb

## 2015-07-28 DIAGNOSIS — J069 Acute upper respiratory infection, unspecified: Secondary | ICD-10-CM | POA: Diagnosis not present

## 2015-07-28 DIAGNOSIS — H6591 Unspecified nonsuppurative otitis media, right ear: Secondary | ICD-10-CM

## 2015-07-28 MED ORDER — AZITHROMYCIN 250 MG PO TABS: ORAL_TABLET | ORAL | Status: DC

## 2015-07-28 MED ORDER — MAGIC MOUTHWASH W/LIDOCAINE: 5.0000 mL | Freq: Three times a day (TID) | ORAL | Status: DC | PRN

## 2015-07-28 NOTE — Patient Instructions (Addendum)
It was great seeing you again. I am sorry you are feeling so bad. Your exam is consistent with an upper respiratory infection. More than likely this is a viral condition and you should start to feel better in the next 2-3 days.   The best thing you can do for the hoarseness is rest your voice.   Use Flonase and normal saline nasal spray to help keep the sinus cavities open and draining. You can also use a decongestant like Claritin D or Sudafed.   Rest and stay well hydrated.    You also have an ear infection. I have sent in a prescription for a Z-pak. Please take as directed. Take medication with food as it can cause diarrhea.    Otitis Media With Effusion Otitis media with effusion is the presence of fluid in the middle ear. This is a common problem in children, which often follows ear infections. It may be present for weeks or longer after the infection. Unlike an acute ear infection, otitis media with effusion refers only to fluid behind the ear drum and not infection. Children with repeated ear and sinus infections and allergy problems are the most likely to get otitis media with effusion. CAUSES  The most frequent cause of the fluid buildup is dysfunction of the eustachian tubes. These are the tubes that drain fluid in the ears to the back of the nose (nasopharynx). SYMPTOMS   The main symptom of this condition is hearing loss. As a result, you or your child may:  Listen to the TV at a loud volume.  Not respond to questions.  Ask "what" often when spoken to.  Mistake or confuse one sound or word for another.  There may be a sensation of fullness or pressure but usually not pain. DIAGNOSIS   Your health care provider will diagnose this condition by examining you or your child's ears.  Your health care provider may test the pressure in you or your child's ear with a tympanometer.  A hearing test may be conducted if the problem persists. TREATMENT   Treatment depends on the  duration and the effects of the effusion.  Antibiotics, decongestants, nose drops, and cortisone-type drugs (tablets or nasal spray) may not be helpful.  Children with persistent ear effusions may have delayed language or behavioral problems. Children at risk for developmental delays in hearing, learning, and speech may require referral to a specialist earlier than children not at risk.  You or your child's health care provider may suggest a referral to an ear, nose, and throat surgeon for treatment. The following may help restore normal hearing:  Drainage of fluid.  Placement of ear tubes (tympanostomy tubes).  Removal of adenoids (adenoidectomy). HOME CARE INSTRUCTIONS   Avoid secondhand smoke.  Infants who are breastfed are less likely to have this condition.  Avoid feeding infants while they are lying flat.  Avoid known environmental allergens.  Avoid people who are sick. SEEK MEDICAL CARE IF:   Hearing is not better in 3 months.  Hearing is worse.  Ear pain.  Drainage from the ear.  Dizziness. MAKE SURE YOU:   Understand these instructions.  Will watch your condition.  Will get help right away if you are not doing well or get worse.   This information is not intended to replace advice given to you by your health care provider. Make sure you discuss any questions you have with your health care provider.   Document Released: 09/21/2004 Document Revised: 09/04/2014 Document Reviewed:  03/11/2013 Elsevier Interactive Patient Education [employer].

## 2015-07-28 NOTE — Progress Notes (Signed)
Pre visit review using our clinic review tool, if applicable. No additional management support is needed unless otherwise documented below in the visit note. 

## 2015-07-28 NOTE — Progress Notes (Signed)
Subjective:    Patient ID: Gwendolyn Scott, female    DOB: [birth year], 39 y.o.   MRN: [id number]  HPI  39 year old female who presents to the office today with a constellation of upper respiratory infection type symptoms. Her symptoms include dry cough, chest congestion, hoarseness, body aches, fatigue, ear pain, sinus pain and pressure and a sore throat and have been present for four days.   She has been using Mucinex for the last three days without relief  Review of Systems  Constitutional: Positive for diaphoresis, activity change and fatigue. Negative for fever, chills and appetite change.  HENT: Positive for congestion, ear pain (right ear), postnasal drip, rhinorrhea, sinus pressure, sore throat, trouble swallowing and voice change. Negative for tinnitus.   Eyes: Negative.   Respiratory: Positive for cough, chest tightness and shortness of breath. Negative for wheezing.   Cardiovascular: Negative.   Gastrointestinal: Negative.   Skin: Negative.   Neurological: Positive for headaches.  Hematological: Positive for adenopathy.   Past Medical History  Diagnosis Date  . Grave's disease 2009    I-131  . GERD (gastroesophageal reflux disease)     Social History   Social History  . Marital Status: Divorced    Spouse Name: N/A  . Number of Children: 0  . Years of Education: N/A   Occupational History  . customer service    Social History Main Topics  . Smoking status: Never Smoker   . Smokeless tobacco: Never Used  . Alcohol Use: No  . Drug Use: No  . Sexual Activity: Not on file   Other Topics Concern  . Not on file   Social History Narrative   She works as [employer]    Not married    No children    She likes to go to [place]     No past surgical history on file.  Family History  Problem Relation Age of Onset  . Hypothyroidism Mother   . Hypothyroidism Maternal Grandmother   . Diabetes Maternal Aunt   . Colon cancer Neg Hx      Allergies  Allergen Reactions  . Penicillins Other (See Comments)    Unknown reaction    Current Outpatient Prescriptions on File Prior to Visit  Medication Sig Dispense Refill  . acetaminophen (TYLENOL) 500 MG tablet Take 500 mg by mouth every 6 (six) hours as needed.      [name] levothyroxine (SYNTHROID, LEVOTHROID) 75 MCG tablet Take 1 tablet by mouth  daily before breakfast 90 tablet 1  . Lorcaserin HCl 10 MG TABS Take 10 mg by mouth 2 (two) times daily. 60 tablet 0  . medroxyPROGESTERone (DEPO-PROVERA) 150 MG/ML injection Inject 150 mg as directed Every 3 months.    [name] omeprazole (PRILOSEC) 40 MG capsule Take 1 capsule by mouth  daily at least 30 minutes  prior to first meal of the  day 90 capsule 0   No current facility-administered medications on file prior to visit.    BP 118/80 mmHg  Pulse 87  Temp(Src) 98.6 F (37 C) (Oral)  Ht '5\' 1"'$  (1.549 m)  Wt 237 lb 11.2 oz (107.82 kg)  BMI 44.94 kg/m2  SpO2 97%       Objective:   Physical Exam  Constitutional: She is oriented to person, place, and time. She appears well-developed and well-nourished. No distress.  HENT:  Head: Normocephalic and atraumatic.  Right Ear: External ear normal.  Left Ear: External ear normal.  Nose: Nose  normal.  Mouth/Throat: Oropharynx is clear and moist. No oropharyngeal exudate.  Cerumen impaction in right ear.   Once cerumen impaction was dislodged, it was noticed that she has otitis media with effusion  Eyes: Conjunctivae and EOM are normal. Pupils are equal, round, and reactive to light. Right eye exhibits no discharge. Left eye exhibits no discharge.  Neck: Normal range of motion. Neck supple. No thyromegaly present.  Cardiovascular: Normal rate, regular rhythm, normal heart sounds and intact distal pulses.  Exam reveals no gallop and no friction rub.   No murmur heard. Pulmonary/Chest: Effort normal and breath sounds normal. No respiratory distress. She has no wheezes. She has no rales.  She exhibits no tenderness.  Lymphadenopathy:    She has cervical adenopathy.  Neurological: She is alert and oriented to person, place, and time.  Skin: Skin is warm and dry. No rash noted. She is not diaphoretic. No erythema. No pallor.  Psychiatric: She has a normal mood and affect. Her behavior is normal. Judgment and thought content normal.  Nursing note and vitals reviewed.     Assessment & Plan:  1. Otitis media with effusion, right - azithromycin (ZITHROMAX Z-PAK) 250 MG tablet; Take 2 tablets on Day 1.  Then take 1 tablet daily.  Dispense: 6 tablet; Refill: 0 - due to PCN allergy  2. Acute upper respiratory infection - likely viral but will cover with Azithromycin  - magic mouthwash w/lidocaine SOLN; Take 5 mLs by mouth 3 (three) times daily as needed for mouth pain. Gargle and spit  Dispense: 180 mL; Refill: 0 - Flonase, normal saline spray.  - warm saltwater gargles - rest and stay hydrated

## 2015-08-16 ENCOUNTER — Encounter: Payer: Commercial Managed Care - PPO | Admitting: Obstetrics & Gynecology

## 2015-08-25 ENCOUNTER — Other Ambulatory Visit: Payer: Commercial Managed Care - PPO

## 2015-09-01 ENCOUNTER — Encounter: Payer: Commercial Managed Care - PPO | Admitting: Adult Health

## 2015-09-07 ENCOUNTER — Encounter: Payer: Self-pay | Admitting: Occupational Therapy

## 2015-09-07 NOTE — Therapy (Signed)
[facility] [address] [place], [place], [address] Phone: [phone]   Fax:  [phone]  Patient Details  Name: Gwendolyn Scott MRN: [id number] Date of Birth: 1975/11/24 Referring Provider:  No ref. provider found  Encounter Date: 09/07/2015 OCCUPATIONAL THERAPY DISCHARGE SUMMARY  Visits from Start of Care: 1  Current functional level related to goals / functional outcomes: Pt was seen for 3 visits then did not return.   Remaining deficits: Unknown, pt did not return.    Education / Equipment: Pt was educated regarding splint wear schedule and HEP. Education was not completed as pt did not return.  Plan: Patient agrees to discharge.  Patient goals were not met. Patient is being discharged due to not returning since the last visit.  ?????      RINE,KATHRYN 09/07/2015, 12:19 PM Theone Murdoch, OTR/L [phone] Phone: [phone] 12:19 PM 01/10/2017[facility] [address] [place], [place], [address] Phone: [phone]   Fax:  [phone]

## 2015-10-01 ENCOUNTER — Other Ambulatory Visit: Payer: Self-pay | Admitting: Adult Health

## 2015-10-01 ENCOUNTER — Telehealth: Payer: Self-pay | Admitting: Adult Health

## 2015-10-01 DIAGNOSIS — Z0000 Encounter for general adult medical examination without abnormal findings: Secondary | ICD-10-CM

## 2015-10-01 NOTE — Telephone Encounter (Signed)
Pt has labs scheduled for Monday and would like to go to [facility] for these labs. Can a order be placed for her to go there.

## 2015-10-01 NOTE — Telephone Encounter (Signed)
Labs entered for Gwendolyn Scott.

## 2015-10-04 ENCOUNTER — Other Ambulatory Visit: Payer: Commercial Managed Care - PPO

## 2015-10-04 ENCOUNTER — Other Ambulatory Visit (INDEPENDENT_AMBULATORY_CARE_PROVIDER_SITE_OTHER): Payer: Commercial Managed Care - PPO

## 2015-10-04 DIAGNOSIS — Z0000 Encounter for general adult medical examination without abnormal findings: Secondary | ICD-10-CM | POA: Diagnosis not present

## 2015-10-04 LAB — CBC WITH DIFFERENTIAL/PLATELET
BASOS PCT: 0.4 % (ref 0.0–3.0)
Basophils Absolute: 0 10*3/uL (ref 0.0–0.1)
EOS ABS: 0.1 10*3/uL (ref 0.0–0.7)
EOS PCT: 1.3 % (ref 0.0–5.0)
HEMATOCRIT: 38.9 % (ref 36.0–46.0)
HEMOGLOBIN: 12.8 g/dL (ref 12.0–15.0)
LYMPHS PCT: 30.4 % (ref 12.0–46.0)
Lymphs Abs: 3.1 10*3/uL (ref 0.7–4.0)
MCHC: 32.9 g/dL (ref 30.0–36.0)
MCV: 82.3 fl (ref 78.0–100.0)
Monocytes Absolute: 0.5 10*3/uL (ref 0.1–1.0)
Monocytes Relative: 5.3 % (ref 3.0–12.0)
NEUTROS ABS: 6.4 10*3/uL (ref 1.4–7.7)
Neutrophils Relative %: 62.6 % (ref 43.0–77.0)
PLATELETS: 324 10*3/uL (ref 150.0–400.0)
RBC: 4.73 Mil/uL (ref 3.87–5.11)
RDW: 16.4 % — AB (ref 11.5–15.5)
WBC: 10.2 10*3/uL (ref 4.0–10.5)

## 2015-10-04 LAB — LIPID PANEL
CHOL/HDL RATIO: 4
Cholesterol: 122 mg/dL (ref 0–200)
HDL: 30.6 mg/dL — AB (ref >39.00)
LDL Cholesterol: 79 mg/dL (ref 0–99)
NONHDL: 91.24
Triglycerides: 59 mg/dL (ref 0.0–149.0)
VLDL: 11.8 mg/dL (ref 0.0–40.0)

## 2015-10-04 LAB — BASIC METABOLIC PANEL
BUN: 7 mg/dL (ref 6–23)
CALCIUM: 9.7 mg/dL (ref 8.4–10.5)
CO2: 27 meq/L (ref 19–32)
CREATININE: 0.75 mg/dL (ref 0.40–1.20)
Chloride: 107 mEq/L (ref 96–112)
GFR: 109.26 mL/min (ref >60.00)
GLUCOSE: 89 mg/dL (ref 70–99)
Potassium: 4.2 mEq/L (ref 3.5–5.1)
SODIUM: 142 meq/L (ref 135–145)

## 2015-10-04 LAB — HEPATIC FUNCTION PANEL
ALK PHOS: 58 U/L (ref 39–117)
ALT: 8 U/L (ref 0–35)
AST: 11 U/L (ref 0–37)
Albumin: 3.7 g/dL (ref 3.5–5.2)
BILIRUBIN TOTAL: 0.7 mg/dL (ref 0.2–1.2)
Bilirubin, Direct: 0.2 mg/dL (ref 0.0–0.3)
Total Protein: 7.9 g/dL (ref 6.0–8.3)

## 2015-10-04 LAB — TSH: TSH: 2.34 u[IU]/mL (ref 0.35–4.50)

## 2015-10-11 ENCOUNTER — Other Ambulatory Visit: Payer: Self-pay | Admitting: Adult Health

## 2015-10-11 ENCOUNTER — Other Ambulatory Visit: Payer: Self-pay | Admitting: *Deleted

## 2015-10-11 ENCOUNTER — Encounter: Payer: Self-pay | Admitting: Adult Health

## 2015-10-11 ENCOUNTER — Ambulatory Visit (INDEPENDENT_AMBULATORY_CARE_PROVIDER_SITE_OTHER): Payer: Commercial Managed Care - PPO | Admitting: Adult Health

## 2015-10-11 VITALS — BP 102/76 | Temp 98.9°F | Ht 63.0 in | Wt 242.1 lb

## 2015-10-11 DIAGNOSIS — Z0000 Encounter for general adult medical examination without abnormal findings: Secondary | ICD-10-CM | POA: Diagnosis not present

## 2015-10-11 DIAGNOSIS — E039 Hypothyroidism, unspecified: Secondary | ICD-10-CM | POA: Diagnosis not present

## 2015-10-11 DIAGNOSIS — N6489 Other specified disorders of breast: Secondary | ICD-10-CM

## 2015-10-11 NOTE — Progress Notes (Signed)
Subjective:    Patient ID: Gwendolyn Scott, female    DOB: 1976-05-28, 40 y.o.   MRN: [id number]  HPI   Patient presents for yearly preventative medicine examination.  All immunizations and health maintenance protocols were reviewed with the patient and needed orders were placed.   Medication reconciliation,  past medical history, social history, problem list and allergies were reviewed in detail with the patient  Goals were established with regard to weight loss, exercise, and  diet in compliance with medications  She has a new GYN at [facility]  She has hired a Clinical research associate and is exercising twice a week. She is also eating healthy.    Review of Systems  Constitutional: Negative.   HENT: Negative.   Eyes: Negative.   Respiratory: Negative.   Cardiovascular: Negative.   Gastrointestinal: Negative.   Endocrine: Negative.   Genitourinary: Negative.   Musculoskeletal: Negative.   Skin: Negative.   Allergic/Immunologic: Negative.   Neurological: Negative.   Hematological: Negative.   Psychiatric/Behavioral: Negative.   All other systems reviewed and are negative.  Past Medical History  Diagnosis Date  . Grave's disease 2009    I-131  . GERD (gastroesophageal reflux disease)     Social History   Social History  . Marital Status: Divorced    Spouse Name: N/A  . Number of Children: 0  . Years of Education: N/A   Occupational History  . customer service    Social History Main Topics  . Smoking status: Never Smoker   . Smokeless tobacco: Never Used  . Alcohol Use: No  . Drug Use: No  . Sexual Activity: Not on file   Other Topics Concern  . Not on file   Social History Narrative   She works as [employer]    Not married    No children    She likes to go to [place]     No past surgical history on file.  Family History  Problem Relation Age of Onset  . Hypothyroidism Mother   . Hypothyroidism Maternal Grandmother   . Diabetes  Maternal Aunt   . Colon cancer Neg Hx     Allergies  Allergen Reactions  . Penicillins Other (See Comments)    Unknown reaction    Current Outpatient Prescriptions on File Prior to Visit  Medication Sig Dispense Refill  . acetaminophen (TYLENOL) 500 MG tablet Take 500 mg by mouth every 6 (six) hours as needed.      [name] levothyroxine (SYNTHROID, LEVOTHROID) 75 MCG tablet Take 1 tablet by mouth  daily before breakfast 90 tablet 1  . Lorcaserin HCl 10 MG TABS Take 10 mg by mouth 2 (two) times daily. 60 tablet 0  . medroxyPROGESTERone (DEPO-PROVERA) 150 MG/ML injection Inject 150 mg as directed Every 3 months.    [name] omeprazole (PRILOSEC) 40 MG capsule Take 1 capsule by mouth  daily at least 30 minutes  prior to first meal of the  day 90 capsule 0   No current facility-administered medications on file prior to visit.    BP 102/76 mmHg  Temp(Src) 98.9 F (37.2 C) (Oral)  Ht '5\' 3"'$  (1.6 m)  Wt 242 lb 1.6 oz (109.816 kg)  BMI 42.90 kg/m2       Objective:   Physical Exam  Constitutional: She is oriented to person, place, and time. She appears well-developed and well-nourished. No distress.  HENT:  Head: Normocephalic and atraumatic.  Right Ear: External ear normal.  Left Ear: External  ear normal.  Nose: Nose normal.  Mouth/Throat: Oropharynx is clear and moist. No oropharyngeal exudate.  Eyes: Conjunctivae and EOM are normal. Pupils are equal, round, and reactive to light. Right eye exhibits no discharge. Left eye exhibits no discharge. No scleral icterus.  Neck: Normal range of motion. Neck supple. No JVD present. No tracheal deviation present. No thyromegaly present.  Cardiovascular: Normal rate, regular rhythm, normal heart sounds and intact distal pulses.  Exam reveals no gallop and no friction rub.   No murmur heard. Pulmonary/Chest: Effort normal and breath sounds normal. No stridor. No respiratory distress. She has no wheezes. She has no rales. She exhibits no tenderness.    Abdominal: Soft. Bowel sounds are normal. She exhibits no distension and no mass. There is no tenderness. There is no rebound and no guarding.  Genitourinary:  Deferred  Musculoskeletal: Normal range of motion. She exhibits no edema or tenderness.  Lymphadenopathy:    She has no cervical adenopathy.  Neurological: She is alert and oriented to person, place, and time. She has normal reflexes. She displays normal reflexes. No cranial nerve deficit. She exhibits normal muscle tone. Coordination normal.  Skin: Skin is warm and dry. No rash noted. She is not diaphoretic. No erythema. No pallor.  Psychiatric: She has a normal mood and affect. Her behavior is normal. Judgment and thought content normal.  Nursing note and vitals reviewed.     Assessment & Plan:  1. Hypothyroidism, unspecified hypothyroidism type - Controlled on current therapy. Follows up with Endocrinology every 6 months  2. Routine general medical examination at a health care facility - Reviewed labs in detail.  - Will get pap and breast exam at GYN - Continue to eat healthy and exercise - Follow up in one year or sooner if needed

## 2015-10-11 NOTE — Progress Notes (Signed)
Pre visit review using our clinic review tool, if applicable. No additional management support is needed unless otherwise documented below in the visit note. 

## 2015-10-12 ENCOUNTER — Telehealth: Payer: Self-pay | Admitting: Adult Health

## 2015-10-12 ENCOUNTER — Ambulatory Visit: Payer: Commercial Managed Care - PPO

## 2015-10-12 NOTE — Telephone Encounter (Signed)
Noted  

## 2015-10-12 NOTE — Telephone Encounter (Signed)
Pt wants you to know the breast center did schedule her mammogram, so pt does not need a dr order.

## 2015-10-13 ENCOUNTER — Ambulatory Visit (INDEPENDENT_AMBULATORY_CARE_PROVIDER_SITE_OTHER): Payer: Commercial Managed Care - PPO

## 2015-10-13 DIAGNOSIS — N912 Amenorrhea, unspecified: Secondary | ICD-10-CM

## 2015-10-13 MED ORDER — MEDROXYPROGESTERONE ACETATE 150 MG/ML IM SUSP
150.0000 mg | Freq: Once | INTRAMUSCULAR | Status: AC
  Administered 2015-10-13: 150 mg via INTRAMUSCULAR

## 2015-10-18 ENCOUNTER — Inpatient Hospital Stay: Admission: RE | Admit: 2015-10-18 | Payer: Commercial Managed Care - PPO | Source: Ambulatory Visit

## 2015-10-18 ENCOUNTER — Other Ambulatory Visit: Payer: Commercial Managed Care - PPO

## 2016-01-11 ENCOUNTER — Ambulatory Visit (INDEPENDENT_AMBULATORY_CARE_PROVIDER_SITE_OTHER): Payer: Commercial Managed Care - PPO

## 2016-01-11 DIAGNOSIS — N912 Amenorrhea, unspecified: Secondary | ICD-10-CM | POA: Diagnosis not present

## 2016-01-11 MED ORDER — MEDROXYPROGESTERONE ACETATE 150 MG/ML IM SUSP
150.0000 mg | Freq: Once | INTRAMUSCULAR | Status: AC
Start: 2016-01-11 — End: 2016-01-11
  Administered 2016-01-11: 150 mg via INTRAMUSCULAR

## 2016-03-05 ENCOUNTER — Other Ambulatory Visit: Payer: Self-pay | Admitting: Endocrinology

## 2016-03-06 ENCOUNTER — Other Ambulatory Visit: Payer: Self-pay

## 2016-03-06 MED ORDER — LEVOTHYROXINE SODIUM 75 MCG PO TABS: ORAL_TABLET | ORAL | Status: DC

## 2016-04-11 ENCOUNTER — Ambulatory Visit (INDEPENDENT_AMBULATORY_CARE_PROVIDER_SITE_OTHER): Payer: Commercial Managed Care - PPO | Admitting: Endocrinology

## 2016-04-11 ENCOUNTER — Ambulatory Visit (INDEPENDENT_AMBULATORY_CARE_PROVIDER_SITE_OTHER): Payer: Commercial Managed Care - PPO

## 2016-04-11 ENCOUNTER — Encounter: Payer: Self-pay | Admitting: Endocrinology

## 2016-04-11 VITALS — BP 113/77 | HR 88 | Temp 98.1°F | Resp 18 | Ht 61.0 in | Wt 247.0 lb

## 2016-04-11 DIAGNOSIS — E039 Hypothyroidism, unspecified: Secondary | ICD-10-CM

## 2016-04-11 DIAGNOSIS — E890 Postprocedural hypothyroidism: Secondary | ICD-10-CM | POA: Diagnosis not present

## 2016-04-11 DIAGNOSIS — Z3041 Encounter for surveillance of contraceptive pills: Secondary | ICD-10-CM | POA: Diagnosis not present

## 2016-04-11 LAB — TSH: TSH: 4.03 u[IU]/mL (ref 0.35–4.50)

## 2016-04-11 MED ORDER — MEDROXYPROGESTERONE ACETATE 150 MG/ML IM SUSP
150.0000 mg | Freq: Once | INTRAMUSCULAR | Status: AC
  Administered 2016-04-11: 150 mg via INTRAMUSCULAR

## 2016-04-11 NOTE — Progress Notes (Signed)
   Subjective:    Patient ID: Gwendolyn Scott, female    DOB: 01-19-1976, 40 y.o.   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  She takes Depo-Provera (Dr Harrington Challenger).  pt states she feels well in general.    Past Medical History:  Diagnosis Date  . GERD (gastroesophageal reflux disease)   . Grave's disease 2009   I-131    No past surgical history on file.  Social History   Social History  . Marital status: Divorced    Spouse name: N/A  . Number of children: 0  . Years of education: N/A   Occupational History  . customer service [employer]   Social History Main Topics  . Smoking status: Never Smoker  . Smokeless tobacco: Never Used  . Alcohol use No  . Drug use: No  . Sexual activity: Not on file   Other Topics Concern  . Not on file   Social History Narrative   She works as [employer]    Not married    No children    She likes to go to [place]     Current Outpatient Prescriptions on File Prior to Visit  Medication Sig Dispense Refill  . acetaminophen (TYLENOL) 500 MG tablet Take 500 mg by mouth every 6 (six) hours as needed.      [name] levothyroxine (SYNTHROID, LEVOTHROID) 75 MCG tablet TAKE ONE TABLET BY MOUTH ONCE DAILY BEFORE BREAKFAST 30 tablet 0  . medroxyPROGESTERone (DEPO-PROVERA) 150 MG/ML injection Inject 150 mg as directed Every 3 months.    [name] omeprazole (PRILOSEC) 40 MG capsule Take 1 capsule by mouth  daily at least 30 minutes  prior to first meal of the  day 90 capsule 0   No current facility-administered medications on file prior to visit.     Allergies  Allergen Reactions  . Penicillins Other (See Comments)    Unknown reaction    Family History  Problem Relation Age of Onset  . Hypothyroidism Mother   . Hypothyroidism Maternal Grandmother   . Diabetes Maternal Aunt   . Colon cancer Neg Hx     BP 113/77 (BP Location: Left Arm, Cuff Size: Large)   Pulse 88   Temp 98.1 F (36.7 C) (Oral)   Resp 18   Ht '5\' 1"'$  (1.549 m)   Wt 247 lb (112 kg)   BMI 46.67 kg/m   Review of Systems No weight change.      Objective:   Physical Exam VITAL SIGNS:  See vs page GENERAL: no distress NECK: There is no palpable thyroid enlargement.  No thyroid nodule is palpable.  No palpable lymphadenopathy at the anterior neck.     Lab Results  Component Value Date   TSH 4.03 04/11/2016   T3TOTAL 91.8 10/22/2014   T4TOTAL 8.0 10/22/2014      Assessment & Plan:  Post-RAI hypothyroidism: well-replaced.  Please continue the same medication.

## 2016-04-11 NOTE — Patient Instructions (Addendum)
A thyroid blood test is requested for you today.  We'll let you know about the results.  Your thyroid level has been normal on less than a full daily amount of thyroid medication.  This means that your thyroid was not completely destroyed by the radioactive iodine.  This in turn means that your medication need will increase or decrease over time, but is unlikely to stay the same.  Please come back for a follow-up appointment in 6-12 months.

## 2016-04-11 NOTE — Progress Notes (Signed)
Pre visit review using our clinic review tool, if applicable. No additional management support is needed unless otherwise documented below in the visit note. 

## 2016-04-12 ENCOUNTER — Ambulatory Visit: Payer: Commercial Managed Care - PPO

## 2016-06-21 ENCOUNTER — Telehealth: Payer: Self-pay | Admitting: Adult Health

## 2016-06-21 NOTE — Telephone Encounter (Signed)
Please advise 

## 2016-06-21 NOTE — Telephone Encounter (Signed)
° °  Pt call to ask what Tommi Rumps thought about or if he knows anything about the HCG Shot for weight loss. Would like a call back   [phone]

## 2016-06-21 NOTE — Telephone Encounter (Signed)
The HCG is a hormone that the body produces during pregnancy. The FDA has not approved this shot for weight loss and the diet itself is dangerous because it limits you to 500 calories per day.  I would be happy to talk to her during a visit about other, safer ways to lose weight.

## 2016-06-21 NOTE — Telephone Encounter (Signed)
Patient notified. Patient scheduled for appt to discuss other weight loss options.

## 2016-06-22 ENCOUNTER — Ambulatory Visit (INDEPENDENT_AMBULATORY_CARE_PROVIDER_SITE_OTHER): Payer: 59 | Admitting: Adult Health

## 2016-06-22 ENCOUNTER — Encounter: Payer: Self-pay | Admitting: Adult Health

## 2016-06-22 MED ORDER — PHENTERMINE HCL 15 MG PO CAPS: 15.0000 mg | ORAL_CAPSULE | ORAL | 0 refills | Status: DC

## 2016-06-22 NOTE — Progress Notes (Signed)
   Subjective:    Patient ID: Gwendolyn Scott, female    DOB: [birth year], 40 y.o.   MRN: [id number]  HPI  40 year old female who presents to the office today to discuss weight loss options. She has been looking at the HCG diet as well as various other low calorie diets. She has started working out ( walking and cross fit like exercises, stretching) four days a week since August. She has been eating chicken and fish, salads, and plenty of water. Unfortunately she is not seeing results and is actually gaining weight. She does not feel like she has the energy that she was hoping to have.    BP Readings from Last 3 Encounters:  06/22/16 112/64  04/11/16 113/77  10/11/15 102/76    Wt Readings from Last 3 Encounters:  06/22/16 250 lb 14.4 oz (113.8 kg)  04/11/16 247 lb (112 kg)  10/11/15 242 lb 1.6 oz (109.8 kg)     Review of Systems  Constitutional: Negative.   Respiratory: Negative.   Cardiovascular: Negative.   Gastrointestinal: Negative.   Neurological: Negative.   Psychiatric/Behavioral: Negative.   All other systems reviewed and are negative.      Objective:   Physical Exam  Constitutional: She appears well-developed and well-nourished. No distress.  obese  Cardiovascular: Normal rate, regular rhythm, normal heart sounds and intact distal pulses.  Exam reveals no gallop and no friction rub.   No murmur heard. Pulmonary/Chest: Effort normal and breath sounds normal. No respiratory distress. She has no wheezes. She has no rales. She exhibits no tenderness.  Neurological: She is alert.  Skin: Skin is warm and dry. No rash noted. She is not diaphoretic. No erythema. No pallor.  Psychiatric: She has a normal mood and affect. Her behavior is normal. Judgment and thought content normal.  Nursing note and vitals reviewed.     Assessment & Plan:  1. Morbid obesity ([place]) - phentermine 15 MG capsule; Take 1 capsule (15 mg total) by mouth every morning.  Dispense: 30 capsule;  Refill: 0 - Continue with diet and exercise regimen  - Follow up in 4 weeks or sooner if needed  Dorothyann Peng, NP

## 2016-07-24 ENCOUNTER — Ambulatory Visit (INDEPENDENT_AMBULATORY_CARE_PROVIDER_SITE_OTHER): Payer: 59

## 2016-07-24 DIAGNOSIS — Z3042 Encounter for surveillance of injectable contraceptive: Secondary | ICD-10-CM | POA: Diagnosis not present

## 2016-07-24 DIAGNOSIS — N912 Amenorrhea, unspecified: Secondary | ICD-10-CM

## 2016-07-24 LAB — POCT URINE PREGNANCY: PREG TEST UR: NEGATIVE

## 2016-07-24 MED ORDER — MEDROXYPROGESTERONE ACETATE 150 MG/ML IM SUSP
150.0000 mg | Freq: Once | INTRAMUSCULAR | Status: AC
  Administered 2016-07-24: 150 mg via INTRAMUSCULAR

## 2016-07-27 ENCOUNTER — Ambulatory Visit: Payer: 59 | Admitting: Adult Health

## 2016-08-12 ENCOUNTER — Ambulatory Visit: Payer: 59 | Admitting: Family Medicine

## 2016-09-17 NOTE — Progress Notes (Deleted)
   Subjective:    Patient ID: Gwendolyn Scott, female    DOB: Oct 23, 1975, 41 y.o.   MRN: [id number]  HPI Pt returns for f/u of post-RAI hypothyroidism (she had RAI in 2010, for hyperthyroidism, due to [place] dz; she had persistent hyperthyroidism, so she was rx'ed with tapazole; however, she was able to d/c this med in 2012, due to normalization of her TFT; off all thyroid medication, she was noted to have elevated TSH late in 2013, so she was rx'ed synthroid).  She takes Depo-Provera (Dr Harrington Challenger).  pt states she feels well in general.    Review of Systems     Objective:   Physical Exam VITAL SIGNS:  See vs page GENERAL: no distress NECK: There is no palpable thyroid enlargement.  No thyroid nodule is palpable.  No palpable lymphadenopathy at the anterior neck.         Assessment & Plan:

## 2016-09-20 ENCOUNTER — Ambulatory Visit: Payer: 59 | Admitting: Endocrinology

## 2016-09-21 ENCOUNTER — Telehealth: Payer: Self-pay | Admitting: Endocrinology

## 2016-09-21 NOTE — Telephone Encounter (Signed)
No follow up necessary.  

## 2016-09-21 NOTE — Telephone Encounter (Signed)
Patient no showed today's appt. Please advise on how to follow up. °A. No follow up necessary. °B. Follow up urgent. Contact patient immediately. °C. Follow up necessary. Contact patient and schedule visit in ___ days. °D. Follow up advised. Contact patient and schedule visit in ____weeks. ° °

## 2016-09-26 ENCOUNTER — Encounter: Payer: Self-pay | Admitting: Adult Health

## 2016-09-26 ENCOUNTER — Ambulatory Visit (INDEPENDENT_AMBULATORY_CARE_PROVIDER_SITE_OTHER): Payer: 59 | Admitting: Adult Health

## 2016-09-26 VITALS — BP 110/82 | Temp 98.5°F | Wt 248.2 lb

## 2016-09-26 DIAGNOSIS — J0141 Acute recurrent pansinusitis: Secondary | ICD-10-CM

## 2016-09-26 DIAGNOSIS — R110 Nausea: Secondary | ICD-10-CM

## 2016-09-26 MED ORDER — DOXYCYCLINE HYCLATE 100 MG PO CAPS: 100.0000 mg | ORAL_CAPSULE | Freq: Two times a day (BID) | ORAL | 0 refills | Status: DC

## 2016-09-26 MED ORDER — ONDANSETRON HCL 4 MG PO TABS: 4.0000 mg | ORAL_TABLET | Freq: Three times a day (TID) | ORAL | 0 refills | Status: DC | PRN

## 2016-09-26 NOTE — Progress Notes (Signed)
Subjective:    Patient ID: Gwendolyn Scott, female    DOB: December 18, 1975, 41 y.o.   MRN: [id number]  HPI  41 year old feamle who presents to the office for an acute issue. She reports that for the last 3-4 days she has had sinus pain/pressure/ non productive cough, sore throat, nausea, diarrhea, and fatigue.   She has been using cough drops which help with the cough   She has had multiple episodes of diarrhea for the last three days.   Review of Systems  Constitutional: Positive for activity change, chills, diaphoresis and fatigue.  HENT: Positive for congestion, postnasal drip, rhinorrhea, sinus pain, sinus pressure, sore throat and trouble swallowing.   Respiratory: Positive for cough, shortness of breath and wheezing.   Cardiovascular: Negative.   Gastrointestinal: Positive for abdominal pain, diarrhea and nausea. Negative for vomiting.  Musculoskeletal: Positive for myalgias.  Neurological: Negative.  Negative for weakness and headaches.   Past Medical History:  Diagnosis Date  . GERD (gastroesophageal reflux disease)   . Grave's disease 2009   I-131    Social History   Social History  . Marital status: Divorced    Spouse name: N/A  . Number of children: 0  . Years of education: N/A   Occupational History  . customer service [employer]   Social History Main Topics  . Smoking status: Never Smoker  . Smokeless tobacco: Never Used  . Alcohol use No  . Drug use: No  . Sexual activity: Not on file   Other Topics Concern  . Not on file   Social History Narrative   She works as [employer]    Not married    No children    She likes to go to [place]     No past surgical history on file.  Family History  Problem Relation Age of Onset  . Hypothyroidism Mother   . Hypothyroidism Maternal Grandmother   . Diabetes Maternal Aunt   . Colon cancer Neg Hx     Allergies  Allergen Reactions  . Penicillins Other (See Comments)    Unknown reaction     Current Outpatient Prescriptions on File Prior to Visit  Medication Sig Dispense Refill  . acetaminophen (TYLENOL) 500 MG tablet Take 500 mg by mouth every 6 (six) hours as needed.      [name] levothyroxine (SYNTHROID, LEVOTHROID) 75 MCG tablet TAKE ONE TABLET BY MOUTH ONCE DAILY BEFORE BREAKFAST 30 tablet 0  . medroxyPROGESTERone (DEPO-PROVERA) 150 MG/ML injection Inject 150 mg as directed Every 3 months.    [name] omeprazole (PRILOSEC) 40 MG capsule Take 1 capsule by mouth  daily at least 30 minutes  prior to first meal of the  day 90 capsule 0   No current facility-administered medications on file prior to visit.     BP 110/82 (BP Location: Left Arm, Patient Position: Sitting, Cuff Size: Large)   Temp 98.5 F (36.9 C) (Oral)   Wt 248 lb 3.2 oz (112.6 kg)   BMI 46.90 kg/m       Objective:   Physical Exam  Constitutional: She appears well-developed and well-nourished. No distress.  HENT:  Head: Normocephalic and atraumatic.  Right Ear: Hearing, tympanic membrane, external ear and ear canal normal.  Left Ear: Hearing, tympanic membrane, external ear and ear canal normal.  Nose: Mucosal edema and rhinorrhea present. Right sinus exhibits maxillary sinus tenderness and frontal sinus tenderness. Left sinus exhibits maxillary sinus tenderness and frontal sinus tenderness.  Mouth/Throat: Uvula  is midline and oropharynx is clear and moist. No oropharyngeal exudate.  Eyes: Conjunctivae and EOM are normal. Pupils are equal, round, and reactive to light. Left eye exhibits no discharge. No scleral icterus.  Neck: Normal range of motion. Neck supple. No thyromegaly present.  Cardiovascular: Normal rate, regular rhythm, normal heart sounds and intact distal pulses.  Exam reveals no friction rub.   No murmur heard. Pulmonary/Chest: Effort normal and breath sounds normal. No respiratory distress. She has no rales. She exhibits no tenderness.  Abdominal: Soft. Bowel sounds are normal. She exhibits no  distension. There is generalized tenderness (generalized soreness). There is no rigidity, no rebound, no guarding, no CVA tenderness and no tenderness at McBurney's point.  Lymphadenopathy:    She has cervical adenopathy.  Skin: Skin is warm and dry. No rash noted. She is not diaphoretic. No erythema. No pallor.  Psychiatric: She has a normal mood and affect. Her behavior is normal. Judgment and thought content normal.  Nursing note and vitals reviewed.     Assessment & Plan:  1. Acute recurrent pansinusitis - doxycycline (VIBRAMYCIN) 100 MG capsule; Take 1 capsule (100 mg total) by mouth 2 (two) times daily.  Dispense: 14 capsule; Refill: 0  2. Nausea - Possibly gastroenteritis on top of sinus infection. Advised pepto bismol.  - ondansetron (ZOFRAN) 4 MG tablet; Take 1 tablet (4 mg total) by mouth every 8 (eight) hours as needed for nausea or vomiting.  Dispense: 20 tablet; Refill: 0 - [place] diet - stay hydrated - Follow up if no improvement  [employer]

## 2016-09-27 ENCOUNTER — Telehealth: Payer: Self-pay | Admitting: Adult Health

## 2016-09-27 ENCOUNTER — Other Ambulatory Visit: Payer: Self-pay

## 2016-09-27 DIAGNOSIS — J0141 Acute recurrent pansinusitis: Secondary | ICD-10-CM

## 2016-09-27 MED ORDER — DOXYCYCLINE HYCLATE 100 MG PO CAPS: 100.0000 mg | ORAL_CAPSULE | Freq: Two times a day (BID) | ORAL | 0 refills | Status: DC

## 2016-09-27 NOTE — Telephone Encounter (Signed)
Pt need for the Rx that was sent in on yesterday be resent to pharmacy [facility] on [address].

## 2016-09-27 NOTE — Telephone Encounter (Signed)
Rx has been sent in to preferred pharmacy.

## 2016-10-05 ENCOUNTER — Other Ambulatory Visit: Payer: 59

## 2016-10-05 ENCOUNTER — Other Ambulatory Visit (INDEPENDENT_AMBULATORY_CARE_PROVIDER_SITE_OTHER): Payer: 59

## 2016-10-05 DIAGNOSIS — Z0000 Encounter for general adult medical examination without abnormal findings: Secondary | ICD-10-CM | POA: Diagnosis not present

## 2016-10-05 LAB — CBC WITH DIFFERENTIAL/PLATELET
BASOS ABS: 0.1 10*3/uL (ref 0.0–0.1)
Basophils Relative: 0.6 % (ref 0.0–3.0)
Eosinophils Absolute: 0.2 10*3/uL (ref 0.0–0.7)
Eosinophils Relative: 1.9 % (ref 0.0–5.0)
HCT: 37.5 % (ref 36.0–46.0)
Hemoglobin: 12.2 g/dL (ref 12.0–15.0)
LYMPHS ABS: 2.8 10*3/uL (ref 0.7–4.0)
Lymphocytes Relative: 29 % (ref 12.0–46.0)
MCHC: 32.6 g/dL (ref 30.0–36.0)
MCV: 83.2 fl (ref 78.0–100.0)
MONO ABS: 0.5 10*3/uL (ref 0.1–1.0)
MONOS PCT: 5.1 % (ref 3.0–12.0)
NEUTROS ABS: 6.2 10*3/uL (ref 1.4–7.7)
Neutrophils Relative %: 63.4 % (ref 43.0–77.0)
PLATELETS: 349 10*3/uL (ref 150.0–400.0)
RBC: 4.51 Mil/uL (ref 3.87–5.11)
RDW: 17 % — ABNORMAL HIGH (ref 11.5–15.5)
WBC: 9.8 10*3/uL (ref 4.0–10.5)

## 2016-10-05 LAB — LIPID PANEL
CHOLESTEROL: 137 mg/dL (ref 0–200)
HDL: 39.6 mg/dL (ref >39.00)
LDL Cholesterol: 88 mg/dL (ref 0–99)
NONHDL: 97.33
Total CHOL/HDL Ratio: 3
Triglycerides: 46 mg/dL (ref 0.0–149.0)
VLDL: 9.2 mg/dL (ref 0.0–40.0)

## 2016-10-05 LAB — HEPATIC FUNCTION PANEL
ALBUMIN: 3.6 g/dL (ref 3.5–5.2)
ALT: 9 U/L (ref 0–35)
AST: 11 U/L (ref 0–37)
Alkaline Phosphatase: 60 U/L (ref 39–117)
BILIRUBIN TOTAL: 0.6 mg/dL (ref 0.2–1.2)
Bilirubin, Direct: 0.1 mg/dL (ref 0.0–0.3)
Total Protein: 7.4 g/dL (ref 6.0–8.3)

## 2016-10-05 LAB — BASIC METABOLIC PANEL
BUN: 9 mg/dL (ref 6–23)
CALCIUM: 9.5 mg/dL (ref 8.4–10.5)
CO2: 30 meq/L (ref 19–32)
Chloride: 106 mEq/L (ref 96–112)
Creatinine, Ser: 0.73 mg/dL (ref 0.40–1.20)
GFR: 112.17 mL/min (ref >60.00)
GLUCOSE: 98 mg/dL (ref 70–99)
Potassium: 4.4 mEq/L (ref 3.5–5.1)
Sodium: 142 mEq/L (ref 135–145)

## 2016-10-05 LAB — POC URINALSYSI DIPSTICK (AUTOMATED)
BILIRUBIN UA: NEGATIVE
Glucose, UA: NEGATIVE
KETONES UA: NEGATIVE
LEUKOCYTES UA: NEGATIVE
NITRITE UA: NEGATIVE
PH UA: 6
PROTEIN UA: NEGATIVE
Spec Grav, UA: 1.02
Urobilinogen, UA: 0.2

## 2016-10-05 LAB — TSH: TSH: 4.14 u[IU]/mL (ref 0.35–4.50)

## 2016-10-08 ENCOUNTER — Other Ambulatory Visit: Payer: Self-pay | Admitting: Endocrinology

## 2016-10-09 ENCOUNTER — Ambulatory Visit: Payer: 59 | Admitting: Endocrinology

## 2016-10-11 ENCOUNTER — Ambulatory Visit (INDEPENDENT_AMBULATORY_CARE_PROVIDER_SITE_OTHER): Payer: 59 | Admitting: Adult Health

## 2016-10-11 ENCOUNTER — Encounter: Payer: Self-pay | Admitting: Adult Health

## 2016-10-11 VITALS — BP 126/68 | Temp 98.1°F | Ht 61.0 in | Wt 246.6 lb

## 2016-10-11 DIAGNOSIS — Z0000 Encounter for general adult medical examination without abnormal findings: Secondary | ICD-10-CM | POA: Diagnosis not present

## 2016-10-11 DIAGNOSIS — E890 Postprocedural hypothyroidism: Secondary | ICD-10-CM | POA: Diagnosis not present

## 2016-10-11 DIAGNOSIS — N912 Amenorrhea, unspecified: Secondary | ICD-10-CM | POA: Diagnosis not present

## 2016-10-11 NOTE — Progress Notes (Signed)
Subjective:    Patient ID: Gwendolyn Scott, female    DOB: 10/28/1975, 41 y.o.   MRN: [id number]  HPI  Patient presents for yearly preventative medicine examination. She is a pleasant 41 year old female who  has a past medical history of GERD (gastroesophageal reflux disease) and Grave's disease (2009).  All immunizations and health maintenance protocols were reviewed with the patient and needed orders were placed.  Medication reconciliation,  past medical history, social history, problem list and allergies were reviewed in detail with the patient  Goals were established with regard to weight loss, exercise, and  diet in compliance with medications. She continues to exercise and eat healthy   End of life planning was discussed.  Wt Readings from Last 3 Encounters:  10/11/16 246 lb 9.6 oz (111.9 kg)  09/26/16 248 lb 3.2 oz (112.6 kg)  06/22/16 250 lb 14.4 oz (113.8 kg)   She was seen by GYN yesterday and had her pap done. She follows up with endocrinology. She has her mammogram scheduled in April. She does routine care with her dentist and eye doctor.   Review of Systems  Constitutional: Negative.   HENT: Negative.   Eyes: Negative.   Respiratory: Negative.   Cardiovascular: Negative.   Endocrine: Negative.   Genitourinary: Negative.   Musculoskeletal: Negative.   Allergic/Immunologic: Negative.   Neurological: Negative.   Hematological: Negative.   All other systems reviewed and are negative.  Past Medical History:  Diagnosis Date  . GERD (gastroesophageal reflux disease)   . Grave's disease 2009   I-131    Social History   Social History  . Marital status: Divorced    Spouse name: N/A  . Number of children: 0  . Years of education: N/A   Occupational History  . customer service [employer]   Social History Main Topics  . Smoking status: Never Smoker  . Smokeless tobacco: Never Used  . Alcohol use No  . Drug use: No  . Sexual activity: Not on file   Other  Topics Concern  . Not on file   Social History Narrative   She works as [employer]    Not married    No children    She likes to go to [place]     No past surgical history on file.  Family History  Problem Relation Age of Onset  . Hypothyroidism Mother   . Hypothyroidism Maternal Grandmother   . Diabetes Maternal Aunt   . Colon cancer Neg Hx     Allergies  Allergen Reactions  . Penicillins Other (See Comments)    Unknown reaction    Current Outpatient Prescriptions on File Prior to Visit  Medication Sig Dispense Refill  . acetaminophen (TYLENOL) 500 MG tablet Take 500 mg by mouth every 6 (six) hours as needed.      [name] levothyroxine (SYNTHROID, LEVOTHROID) 75 MCG tablet TAKE ONE TABLET BY MOUTH ONCE DAILY BEFORE BREAKFAST 90 tablet 1  . medroxyPROGESTERone (DEPO-PROVERA) 150 MG/ML injection Inject 150 mg as directed Every 3 months.    [name] omeprazole (PRILOSEC) 40 MG capsule Take 1 capsule by mouth  daily at least 30 minutes  prior to first meal of the  day 90 capsule 0   No current facility-administered medications on file prior to visit.     BP 126/68   Temp 98.1 F (36.7 C) (Oral)   Ht '5\' 1"'$  (1.549 m)   Wt 246 lb 9.6 oz (111.9 kg)   BMI  46.59 kg/m       Objective:   Physical Exam  Constitutional: She is oriented to person, place, and time. She appears well-developed and well-nourished. No distress.  Obese   HENT:  Head: Normocephalic and atraumatic.  Right Ear: Hearing, tympanic membrane, external ear and ear canal normal.  Left Ear: Hearing, tympanic membrane, external ear and ear canal normal.  Nose: Nose normal.  Mouth/Throat: Oropharynx is clear and moist. No oropharyngeal exudate.  Eyes: Conjunctivae are normal. Pupils are equal, round, and reactive to light. Right eye exhibits no discharge. Left eye exhibits no discharge. No scleral icterus.  Neck: Trachea normal and normal range of motion. Neck supple. No JVD present. Carotid bruit  is not present. No tracheal deviation present. No thyroid mass and no thyromegaly present.  Cardiovascular: Normal rate, regular rhythm, normal heart sounds and intact distal pulses.  Exam reveals no gallop and no friction rub.   No murmur heard. Pulmonary/Chest: Effort normal and breath sounds normal. No stridor. No respiratory distress. She has no wheezes. She has no rales. She exhibits no tenderness.  Abdominal: Soft. Bowel sounds are normal. She exhibits no distension and no mass. There is no tenderness. There is no rebound and no guarding.  Musculoskeletal: Normal range of motion. She exhibits no edema, tenderness or deformity.  Lymphadenopathy:    She has no cervical adenopathy.  Neurological: She is alert and oriented to person, place, and time. She has normal reflexes. She displays normal reflexes. No cranial nerve deficit. She exhibits normal muscle tone. Coordination normal.  Skin: Skin is warm and dry. No rash noted. She is not diaphoretic. No erythema. No pallor.  Psychiatric: She has a normal mood and affect. Her behavior is normal. Judgment and thought content normal.  Nursing note and vitals reviewed.     Assessment & Plan:  1. Routine general medical examination at a health care facility - Reviewed labs in detail with patient and all questions answered.  - Continue with diet and exercise.  - Follow up in one year for CPE or sooner if needed   2. Hypothyroidism following radioiodine therapy - Follow up with Endocrinology as directed  3. Amenorrhea - Follow up with GYN as directed.    Dorothyann Peng, NP

## 2016-10-12 ENCOUNTER — Encounter: Payer: 59 | Admitting: Adult Health

## 2016-10-13 ENCOUNTER — Ambulatory Visit (INDEPENDENT_AMBULATORY_CARE_PROVIDER_SITE_OTHER): Payer: 59

## 2016-10-13 DIAGNOSIS — N912 Amenorrhea, unspecified: Secondary | ICD-10-CM

## 2016-10-13 MED ORDER — MEDROXYPROGESTERONE ACETATE 150 MG/ML IM SUSP
150.0000 mg | Freq: Once | INTRAMUSCULAR | Status: AC
  Administered 2016-10-13: 150 mg via INTRAMUSCULAR

## 2016-10-13 NOTE — Progress Notes (Signed)
Patient got her depo provera injection for her Amenorrhea on 10/13/2016 at [facility]. Given by Rosealee Albee CMA

## 2016-10-23 ENCOUNTER — Ambulatory Visit: Payer: 59 | Admitting: Endocrinology

## 2016-10-23 DIAGNOSIS — Z0289 Encounter for other administrative examinations: Secondary | ICD-10-CM

## 2016-11-10 ENCOUNTER — Ambulatory Visit (INDEPENDENT_AMBULATORY_CARE_PROVIDER_SITE_OTHER): Payer: 59 | Admitting: Adult Health

## 2016-11-10 ENCOUNTER — Encounter: Payer: Self-pay | Admitting: Adult Health

## 2016-11-10 VITALS — BP 134/82 | Temp 98.6°F | Ht 61.0 in | Wt 247.1 lb

## 2016-11-10 DIAGNOSIS — J069 Acute upper respiratory infection, unspecified: Secondary | ICD-10-CM | POA: Diagnosis not present

## 2016-11-10 MED ORDER — PREDNISONE 10 MG PO TABS: ORAL_TABLET | ORAL | 0 refills | Status: DC

## 2016-11-10 NOTE — Progress Notes (Signed)
Subjective:    Patient ID: Destyni Hoppel, female    DOB: [birth year], 41 y.o.   MRN: [id number]  URI   This is a new problem. The current episode started in the past 7 days (2 days ). The maximum temperature recorded prior to her arrival was 101 - 101.9 F. The fever has been present for less than 1 day. Associated symptoms include congestion, coughing, ear pain, a sore throat and wheezing. Pertinent negatives include no rhinorrhea or sinus pain. She has tried acetaminophen and decongestant for the symptoms. The treatment provided mild relief.    Review of Systems  Constitutional: Positive for fatigue and fever (resolved).  HENT: Positive for congestion, ear pain, sore throat and voice change. Negative for ear discharge, rhinorrhea, sinus pain, sinus pressure and trouble swallowing.   Eyes: Negative.   Respiratory: Positive for cough, chest tightness and wheezing. Negative for shortness of breath.   Cardiovascular: Negative.   Neurological: Negative.    Past Medical History:  Diagnosis Date  . GERD (gastroesophageal reflux disease)   . Grave's disease 2009   I-131    Social History   Social History  . Marital status: Divorced    Spouse name: N/A  . Number of children: 0  . Years of education: N/A   Occupational History  . customer service [employer]   Social History Main Topics  . Smoking status: Never Smoker  . Smokeless tobacco: Never Used  . Alcohol use No  . Drug use: No  . Sexual activity: Not on file   Other Topics Concern  . Not on file   Social History Narrative   She works as [employer]    Not married    No children    She likes to go to [employer]     No past surgical history on file.  Family History  Problem Relation Age of Onset  . Hypothyroidism Mother   . Hypothyroidism Maternal Grandmother   . Diabetes Maternal Aunt   . Colon cancer Neg Hx     Allergies  Allergen Reactions  . Penicillins Other (See Comments)    Unknown  reaction    Current Outpatient Prescriptions on File Prior to Visit  Medication Sig Dispense Refill  . acetaminophen (TYLENOL) 500 MG tablet Take 500 mg by mouth every 6 (six) hours as needed.      [name] levothyroxine (SYNTHROID, LEVOTHROID) 75 MCG tablet TAKE ONE TABLET BY MOUTH ONCE DAILY BEFORE BREAKFAST 90 tablet 1  . medroxyPROGESTERone (DEPO-PROVERA) 150 MG/ML injection Inject 150 mg as directed Every 3 months.    [name] omeprazole (PRILOSEC) 40 MG capsule Take 1 capsule by mouth  daily at least 30 minutes  prior to first meal of the  day 90 capsule 0   No current facility-administered medications on file prior to visit.     BP 134/82 (BP Location: Left Arm, Patient Position: Sitting, Cuff Size: Normal)   Temp 98.6 F (37 C) (Oral)   Ht '5\' 1"'$  (1.549 m)   Wt 247 lb 1.6 oz (112.1 kg)   BMI 46.69 kg/m       Objective:   Physical Exam  Constitutional: She is oriented to person, place, and time. She appears well-developed and well-nourished. No distress.  HENT:  Head: Normocephalic and atraumatic.  Right Ear: External ear normal.  Left Ear: External ear normal.  Nose: Nose normal.  Mouth/Throat: Oropharynx is clear and moist. No oropharyngeal exudate.  Eyes: Conjunctivae and EOM are normal. Pupils  are equal, round, and reactive to light. Right eye exhibits no discharge. Left eye exhibits no discharge.  Neck: Normal range of motion. Neck supple.  Cardiovascular: Normal rate, regular rhythm, normal heart sounds and intact distal pulses.  Exam reveals no gallop and no friction rub.   No murmur heard. Pulmonary/Chest: Effort normal and breath sounds normal. No respiratory distress. She has no wheezes. She has no rales. She exhibits no tenderness.  Lymphadenopathy:    She has no cervical adenopathy.  Neurological: She is alert and oriented to person, place, and time.  Skin: Skin is warm and dry. No rash noted. She is not diaphoretic. No erythema. No pallor.  Psychiatric: She has a normal  mood and affect. Her behavior is normal. Judgment and thought content normal.  Nursing note and vitals reviewed.      Assessment & Plan:  1. Upper respiratory tract infection, unspecified type - No signs of infection.  - Will prescribe prednisone to help with symptoms  - predniSONE (DELTASONE) 10 MG tablet; 40 mg x 3 days, 20 mg x 3 days, 10 mg x 3 days  Dispense: 21 tablet; Refill: 0 - Follow up if no improvement   Dorothyann Peng, NP

## 2016-11-13 ENCOUNTER — Telehealth: Payer: Self-pay | Admitting: Adult Health

## 2016-11-13 NOTE — Telephone Encounter (Signed)
Spoke with pt and she c/o continued cough and chest congestion, no ShOB or fever.Advised her to try Mucinex for cough and congestion. Also make sure she is very well hydrated. Take prednisone as directed. Call office if Mucinex does not help, symptoms worsen or pt develops fever or ShOB. Pt voiced understanding. Nothing further needed at this time.

## 2016-11-13 NOTE — Telephone Encounter (Signed)
Pt saw Tommi Rumps on Friday and states she feels worse. Pt declined to make an appt for today, and aware Tommi Rumps is out until tomorrow. Pt states her cough seems worse, and feels like she cannot get whatever is in her chest "up".  Pt wanted Tommi Rumps to call her in something else. Advised pt I would have the nurse call her back.

## 2016-11-14 ENCOUNTER — Ambulatory Visit (INDEPENDENT_AMBULATORY_CARE_PROVIDER_SITE_OTHER): Payer: 59 | Admitting: Adult Health

## 2016-11-14 ENCOUNTER — Encounter: Payer: Self-pay | Admitting: Adult Health

## 2016-11-14 VITALS — BP 104/62 | Temp 98.3°F | Ht 61.0 in | Wt 248.8 lb

## 2016-11-14 DIAGNOSIS — J069 Acute upper respiratory infection, unspecified: Secondary | ICD-10-CM | POA: Diagnosis not present

## 2016-11-14 MED ORDER — PREDNISONE 50 MG PO TABS: ORAL_TABLET | ORAL | 0 refills | Status: DC

## 2016-11-14 MED ORDER — IPRATROPIUM-ALBUTEROL 0.5-2.5 (3) MG/3ML IN SOLN
3.0000 mL | Freq: Once | RESPIRATORY_TRACT | Status: AC
  Administered 2016-11-14: 3 mL via RESPIRATORY_TRACT

## 2016-11-14 MED ORDER — HYDROCODONE-HOMATROPINE 5-1.5 MG/5ML PO SYRP: 5.0000 mL | ORAL_SOLUTION | Freq: Three times a day (TID) | ORAL | 0 refills | Status: DC | PRN

## 2016-11-14 MED ORDER — DOXYCYCLINE HYCLATE 100 MG PO CAPS: 100.0000 mg | ORAL_CAPSULE | Freq: Two times a day (BID) | ORAL | 0 refills | Status: DC

## 2016-11-14 NOTE — Progress Notes (Signed)
Subjective:    Patient ID: Maryelizabeth Eberle, female    DOB: [birth year], 41 y.o.   MRN: [id number]  HPI  41 year old female who presents to the office today for URI like symptoms. She was seen 4 days ago with productive cough, sore throat, low grade fevers, and ear pain. She was prescribed a course of prednisone which she reports did not help at all.   Today in the office she reports " I feel worse". She continues to have low grade fevers, productive cough, sore throat, ear pain, sinus pain and pressure, headaches, and fatigue   She denies any n/v/d   Review of Systems  Constitutional: Positive for activity change, appetite change, fatigue and fever. Negative for chills and diaphoresis.  HENT: Positive for congestion, postnasal drip, rhinorrhea, sinus pain, sinus pressure, sore throat and voice change. Negative for trouble swallowing.   Respiratory: Positive for cough, chest tightness, shortness of breath and wheezing.    See HPI   Past Medical History:  Diagnosis Date  . GERD (gastroesophageal reflux disease)   . Grave's disease 2009   I-131    Social History   Social History  . Marital status: Divorced    Spouse name: N/A  . Number of children: 0  . Years of education: N/A   Occupational History  . customer service [employer]   Social History Main Topics  . Smoking status: Never Smoker  . Smokeless tobacco: Never Used  . Alcohol use No  . Drug use: No  . Sexual activity: Not on file   Other Topics Concern  . Not on file   Social History Narrative   She works as [employer]    Not married    No children    She likes to go to [employer]     No past surgical history on file.  Family History  Problem Relation Age of Onset  . Hypothyroidism Mother   . Hypothyroidism Maternal Grandmother   . Diabetes Maternal Aunt   . Colon cancer Neg Hx     Allergies  Allergen Reactions  . Penicillins Other (See Comments)    Unknown reaction    Current  Outpatient Prescriptions on File Prior to Visit  Medication Sig Dispense Refill  . acetaminophen (TYLENOL) 500 MG tablet Take 500 mg by mouth every 6 (six) hours as needed.      [name] levothyroxine (SYNTHROID, LEVOTHROID) 75 MCG tablet TAKE ONE TABLET BY MOUTH ONCE DAILY BEFORE BREAKFAST 90 tablet 1  . medroxyPROGESTERone (DEPO-PROVERA) 150 MG/ML injection Inject 150 mg as directed Every 3 months.    [name] omeprazole (PRILOSEC) 40 MG capsule Take 1 capsule by mouth  daily at least 30 minutes  prior to first meal of the  day 90 capsule 0  . predniSONE (DELTASONE) 10 MG tablet 40 mg x 3 days, 20 mg x 3 days, 10 mg x 3 days 21 tablet 0   No current facility-administered medications on file prior to visit.     BP 104/62 (BP Location: Left Arm, Patient Position: Sitting, Cuff Size: Normal)   Temp 98.3 F (36.8 C) (Oral)   Ht '5\' 1"'$  (1.549 m)   Wt 248 lb 12.8 oz (112.9 kg)   BMI 47.01 kg/m       Objective:   Physical Exam  Constitutional: She is oriented to person, place, and time. She appears well-developed and well-nourished. No distress.  HENT:  Head: Normocephalic and atraumatic.  Right Ear: Hearing, tympanic membrane,  external ear and ear canal normal.  Left Ear: Hearing, tympanic membrane, external ear and ear canal normal.  Nose: Mucosal edema and rhinorrhea present. Right sinus exhibits maxillary sinus tenderness and frontal sinus tenderness. Left sinus exhibits maxillary sinus tenderness and frontal sinus tenderness.  Mouth/Throat: Uvula is midline, oropharynx is clear and moist and mucous membranes are normal.  Cardiovascular: Normal rate, regular rhythm, normal heart sounds and intact distal pulses.  Exam reveals no gallop and no friction rub.   No murmur heard. Pulmonary/Chest: Effort normal. No respiratory distress. She has wheezes (throughout lung fields ). She has no rhonchi. She has no rales. She exhibits no tenderness.  Neurological: She is alert and oriented to person, place, and  time.  Skin: Skin is warm and dry. No rash noted. She is not diaphoretic. No erythema. No pallor.  Psychiatric: She has a normal mood and affect. Her behavior is normal.  Nursing note and vitals reviewed.     Assessment & Plan:  1. Upper respiratory tract infection, unspecified type - No rales or rhonchi noted. Will treat with Doxycycline to cover for sinusitis and pneumonia. Will do stronger dose of prednisone for 7 day burst therapy  - doxycycline (VIBRAMYCIN) 100 MG capsule; Take 1 capsule (100 mg total) by mouth 2 (two) times daily.  Dispense: 14 capsule; Refill: 0 - predniSONE (DELTASONE) 50 MG tablet; Take one pill every morning for 7 days  Dispense: 7 tablet; Refill: 0 - HYDROcodone-homatropine (HYCODAN) 5-1.5 MG/5ML syrup; Take 5 mLs by mouth every 8 (eight) hours as needed for cough.  Dispense: 120 mL; Refill: 0 - ipratropium-albuterol (DUONEB) 0.5-2.5 (3) MG/3ML nebulizer solution 3 mL; Take 3 mLs by nebulization once.  - Wheezing had resolved after duo neb. She endorsed feeling as though she was breathing easier.  - Follow up if no improvement   Dorothyann Peng, NP

## 2016-11-15 ENCOUNTER — Encounter: Payer: Self-pay | Admitting: Adult Health

## 2017-01-18 ENCOUNTER — Ambulatory Visit (INDEPENDENT_AMBULATORY_CARE_PROVIDER_SITE_OTHER): Payer: 59

## 2017-01-18 DIAGNOSIS — N912 Amenorrhea, unspecified: Secondary | ICD-10-CM

## 2017-01-18 MED ORDER — MEDROXYPROGESTERONE ACETATE 150 MG/ML IM SUSY
150.0000 mg | PREFILLED_SYRINGE | INTRAMUSCULAR | Status: AC
  Administered 2017-01-18: 150 mg via INTRAMUSCULAR

## 2017-03-06 ENCOUNTER — Other Ambulatory Visit: Payer: Self-pay | Admitting: Family

## 2017-03-06 MED ORDER — OMEPRAZOLE 40 MG PO CPDR: 40.0000 mg | DELAYED_RELEASE_CAPSULE | Freq: Every day | ORAL | 0 refills | Status: AC

## 2017-03-16 ENCOUNTER — Encounter: Payer: Self-pay | Admitting: Adult Health

## 2017-03-16 ENCOUNTER — Telehealth: Payer: Self-pay | Admitting: Adult Health

## 2017-03-16 ENCOUNTER — Ambulatory Visit (INDEPENDENT_AMBULATORY_CARE_PROVIDER_SITE_OTHER): Payer: 59 | Admitting: Adult Health

## 2017-03-16 ENCOUNTER — Ambulatory Visit (INDEPENDENT_AMBULATORY_CARE_PROVIDER_SITE_OTHER): Payer: 59

## 2017-03-16 VITALS — BP 116/80 | Temp 98.2°F | Wt 249.0 lb

## 2017-03-16 DIAGNOSIS — M79672 Pain in left foot: Secondary | ICD-10-CM | POA: Diagnosis not present

## 2017-03-16 NOTE — Progress Notes (Signed)
Subjective:    Patient ID: Gwendolyn Scott, female    DOB: 1975-11-02, 41 y.o.   MRN: [id number]  HPI  41 year old female who  has a past medical history of GERD (gastroesophageal reflux disease) and Grave's disease (2009). She presents to the office today for the complaint of left pinkie toe pain. She reports that she had hit her pinkie toe on a door stop about 4 days ago. Since that time she has had swelling in her left pinkie toe with pain to left pinkie toe and pain to sole of left foot.   She denies any bruising   Weightbearing exacerbates pain.    Review of Systems See HPI   Past Medical History:  Diagnosis Date  . GERD (gastroesophageal reflux disease)   . Grave's disease 2009   I-131    Social History   Social History  . Marital status: Divorced    Spouse name: N/A  . Number of children: 0  . Years of education: N/A   Occupational History  . customer service [employer]   Social History Main Topics  . Smoking status: Never Smoker  . Smokeless tobacco: Never Used  . Alcohol use No  . Drug use: No  . Sexual activity: Not on file   Other Topics Concern  . Not on file   Social History Narrative   She works as [employer]    Not married    No children    She likes to go to [employer]     No past surgical history on file.  Family History  Problem Relation Age of Onset  . Hypothyroidism Mother   . Hypothyroidism Maternal Grandmother   . Diabetes Maternal Aunt   . Colon cancer Neg Hx     Allergies  Allergen Reactions  . Penicillins Other (See Comments)    Unknown reaction    Current Outpatient Prescriptions on File Prior to Visit  Medication Sig Dispense Refill  . acetaminophen (TYLENOL) 500 MG tablet Take 500 mg by mouth every 6 (six) hours as needed.      [name] levothyroxine (SYNTHROID, LEVOTHROID) 75 MCG tablet TAKE ONE TABLET BY MOUTH ONCE DAILY BEFORE BREAKFAST 90 tablet 1  . medroxyPROGESTERone (DEPO-PROVERA) 150 MG/ML injection  Inject 150 mg as directed Every 3 months.    [name] omeprazole (PRILOSEC) 40 MG capsule Take 1 capsule (40 mg total) by mouth daily. 90 capsule 0   No current facility-administered medications on file prior to visit.     BP 116/80 (BP Location: Left Arm)   Temp 98.2 F (36.8 C) (Oral)   Wt 249 lb (112.9 kg)   BMI 47.05 kg/m       Objective:   Physical Exam  Constitutional: She is oriented to person, place, and time. She appears well-developed and well-nourished. No distress.  Musculoskeletal: She exhibits edema and tenderness. She exhibits no deformity.  Swelling noted to left pinkie toe.  She has pain with palpation to entire left pinkie toe as well as cuboid bone.   Neurological: She is alert and oriented to person, place, and time.  Skin: Skin is warm and dry. No rash noted. She is not diaphoretic. No erythema. No pallor.  Psychiatric: She has a normal mood and affect. Her behavior is normal. Judgment and thought content normal.  Nursing note and vitals reviewed.     Assessment & Plan:  1. Left foot pain - Will r/o fracture to pinkie and cuboid bone as well as  dislocation.  - Advised buddy tape, rest, ice, and motrin  - Post op shoe provided - DG Foot Complete Left; Future - Consider referral to orthopedics   Dorothyann Peng, NP

## 2017-03-16 NOTE — Telephone Encounter (Signed)
Updated patient on x ray. Advised to use hard sole shoe, buddy tape, OTC pain medication , rest and ice

## 2017-04-16 ENCOUNTER — Other Ambulatory Visit: Payer: Self-pay | Admitting: Endocrinology

## 2017-04-16 NOTE — Telephone Encounter (Signed)
MEDICATION: levothyroxine 75 mcg tab  PHARMACY: [facility]    IS THIS A 90 DAY SUPPLY : yes  IS PATIENT OUT OF MEDICTAION: yes  IF NOT; HOW MUCH IS LEFT:   LAST APPOINTMENT DATE: 04/11/2016  NEXT APPOINTMENT DATE: 08/22  OTHER COMMENTS:    **Let patient know to contact pharmacy at the end of the day to make sure medication is ready. **  ** Please notify patient to allow 48-72 hours to process**  **Encourage patient to contact the pharmacy for refills or they can request refills through [facility]**

## 2017-04-17 ENCOUNTER — Other Ambulatory Visit: Payer: Self-pay

## 2017-04-17 MED ORDER — LEVOTHYROXINE SODIUM 75 MCG PO TABS
ORAL_TABLET | ORAL | 1 refills | Status: DC
Start: 2017-04-17 — End: 2017-04-18

## 2017-04-18 ENCOUNTER — Ambulatory Visit (INDEPENDENT_AMBULATORY_CARE_PROVIDER_SITE_OTHER): Payer: 59 | Admitting: Endocrinology

## 2017-04-18 ENCOUNTER — Encounter: Payer: Self-pay | Admitting: Endocrinology

## 2017-04-18 ENCOUNTER — Telehealth: Payer: Self-pay | Admitting: Endocrinology

## 2017-04-18 VITALS — BP 120/78 | HR 90 | Wt 250.4 lb

## 2017-04-18 DIAGNOSIS — E890 Postprocedural hypothyroidism: Secondary | ICD-10-CM

## 2017-04-18 LAB — TSH: TSH: 6.5 u[IU]/mL — AB (ref 0.35–4.50)

## 2017-04-18 MED ORDER — LEVOTHYROXINE SODIUM 88 MCG PO TABS: 88.0000 ug | ORAL_TABLET | Freq: Every day | ORAL | 3 refills | Status: DC

## 2017-04-18 NOTE — Patient Instructions (Signed)
A thyroid blood test is requested for you today.  We'll let you know about the results.  Your thyroid level has been normal on less than a full daily amount of thyroid medication.  This means that your thyroid was not completely destroyed by the radioactive iodine.  This in turn means that your medication need will increase or decrease over time, but is unlikely to stay the same.  Please come back for a follow-up appointment in 1 year.

## 2017-04-18 NOTE — Progress Notes (Signed)
   Subjective:    Patient ID: Gwendolyn Scott, female    DOB: 25-Dec-1975, 41 y.o.   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  pt states she feels well in general, except for constipation. Past Medical History:  Diagnosis Date  . GERD (gastroesophageal reflux disease)   . Grave's disease 2009   I-131    No past surgical history on file.  Social History   Social History  . Marital status: Divorced    Spouse name: N/A  . Number of children: 0  . Years of education: N/A   Occupational History  . customer service [employer]   Social History Main Topics  . Smoking status: Never Smoker  . Smokeless tobacco: Never Used  . Alcohol use No  . Drug use: No  . Sexual activity: Not on file   Other Topics Concern  . Not on file   Social History Narrative   She works as [employer]    Not married    No children    She likes to go to [place]     Current Outpatient Prescriptions on File Prior to Visit  Medication Sig Dispense Refill  . acetaminophen (TYLENOL) 500 MG tablet Take 500 mg by mouth every 6 (six) hours as needed.      . medroxyPROGESTERone (DEPO-PROVERA) 150 MG/ML injection Inject 150 mg as directed Every 3 months.    [name] omeprazole (PRILOSEC) 40 MG capsule Take 1 capsule (40 mg total) by mouth daily. 90 capsule 0   No current facility-administered medications on file prior to visit.     Allergies  Allergen Reactions  . Penicillins Other (See Comments)    Unknown reaction    Family History  Problem Relation Age of Onset  . Hypothyroidism Mother   . Hypothyroidism Maternal Grandmother   . Diabetes  Maternal Aunt   . Colon cancer Neg Hx     BP 120/78   Pulse 90   Wt 250 lb 6.4 oz (113.6 kg)   SpO2 97%   BMI 47.31 kg/m    Review of Systems Denies dry skin    Objective:   Physical Exam VITAL SIGNS:  See vs page GENERAL: no distress. NECK: There is no palpable thyroid enlargement.  No thyroid nodule is palpable.  No palpable lymphadenopathy at the anterior neck.        Assessment & Plan:  Hypothyroidism: due for recheck  Patient Instructions  A thyroid blood test is requested for you today.  We'll let you know about the results.  Your thyroid level has been normal on less than a full daily amount of thyroid medication.  This means that your thyroid was not completely destroyed by the radioactive iodine.  This in turn means that your medication need will increase or decrease over time, but is unlikely to stay the same.  Please come back for a follow-up appointment in 1 year.

## 2017-04-18 NOTE — Telephone Encounter (Signed)
Patient needs to know if she needs blood work done today since she had blood work done in April 2018 by her PCP. I advised her that she may have to since it has been a while. Call patient to advise, patient will be at the office at 9:30am.

## 2017-04-19 ENCOUNTER — Other Ambulatory Visit: Payer: Self-pay

## 2017-04-19 MED ORDER — LEVOTHYROXINE SODIUM 88 MCG PO TABS: 88.0000 ug | ORAL_TABLET | Freq: Every day | ORAL | 3 refills | Status: DC

## 2017-05-18 ENCOUNTER — Encounter: Payer: Self-pay | Admitting: Adult Health

## 2017-05-19 ENCOUNTER — Ambulatory Visit: Payer: 59 | Admitting: Family Medicine

## 2017-05-19 NOTE — Progress Notes (Deleted)
OFFICE VISIT  05/19/2017   CC: No chief complaint on file.    HPI:    Patient is a 41 y.o.  female who presents for flank pain.  Past Medical History:  Diagnosis Date  . GERD (gastroesophageal reflux disease)   . Grave's disease 2009   I-131    No past surgical history on file.  Outpatient Medications Prior to Visit  Medication Sig Dispense Refill  . acetaminophen (TYLENOL) 500 MG tablet Take 500 mg by mouth every 6 (six) hours as needed.      [name] levothyroxine (SYNTHROID, LEVOTHROID) 88 MCG tablet Take 1 tablet (88 mcg total) by mouth daily before breakfast. 90 tablet 3  . medroxyPROGESTERone (DEPO-PROVERA) 150 MG/ML injection Inject 150 mg as directed Every 3 months.    [name] omeprazole (PRILOSEC) 40 MG capsule Take 1 capsule (40 mg total) by mouth daily. 90 capsule 0   No facility-administered medications prior to visit.     Allergies  Allergen Reactions  . Penicillins Other (See Comments)    Unknown reaction    ROS As per HPI  PE: There were no vitals taken for this visit. ***  LABS:    Chemistry      Component Value Date/Time   NA 142 10/05/2016 0926   K 4.4 10/05/2016 0926   CL 106 10/05/2016 0926   CO2 30 10/05/2016 0926   BUN 9 10/05/2016 0926   CREATININE 0.73 10/05/2016 0926      Component Value Date/Time   CALCIUM 9.5 10/05/2016 0926   ALKPHOS 60 10/05/2016 0926   AST 11 10/05/2016 0926   ALT 9 10/05/2016 0926   BILITOT 0.6 10/05/2016 0926       IMPRESSION AND PLAN:  No problem-specific Assessment & Plan notes found for this encounter.   FOLLOW UP: No Follow-up on file.

## 2017-06-28 ENCOUNTER — Telehealth: Payer: Self-pay

## 2017-06-28 ENCOUNTER — Ambulatory Visit (INDEPENDENT_AMBULATORY_CARE_PROVIDER_SITE_OTHER): Payer: 59

## 2017-06-28 DIAGNOSIS — Z3200 Encounter for pregnancy test, result unknown: Secondary | ICD-10-CM

## 2017-06-28 DIAGNOSIS — Z308 Encounter for other contraceptive management: Secondary | ICD-10-CM

## 2017-06-28 LAB — POCT URINE PREGNANCY: Preg Test, Ur: NEGATIVE

## 2017-06-28 MED ORDER — MEDROXYPROGESTERONE ACETATE 150 MG/ML IM SUSP
150.0000 mg | Freq: Once | INTRAMUSCULAR | Status: AC
  Administered 2017-06-28: 150 mg via INTRAMUSCULAR

## 2017-06-28 NOTE — Telephone Encounter (Signed)
Pt was late for her Depo provera appointment, a POCT pregnancy was done for confirmation and it was Negative, pt was given her Depo Provera injection.

## 2017-07-10 ENCOUNTER — Encounter: Payer: Self-pay | Admitting: Adult Health

## 2017-07-10 ENCOUNTER — Telehealth: Payer: Self-pay | Admitting: Adult Health

## 2017-07-10 ENCOUNTER — Ambulatory Visit (INDEPENDENT_AMBULATORY_CARE_PROVIDER_SITE_OTHER)
Admission: RE | Admit: 2017-07-10 | Discharge: 2017-07-10 | Disposition: A | Discharge status: Home / Self Care | Payer: 59 | Source: Ambulatory Visit | Attending: Adult Health | Admitting: Adult Health

## 2017-07-10 ENCOUNTER — Ambulatory Visit: Payer: 59 | Admitting: Adult Health

## 2017-07-10 VITALS — BP 116/84 | Temp 98.2°F | Wt 249.0 lb

## 2017-07-10 DIAGNOSIS — M545 Low back pain, unspecified: Secondary | ICD-10-CM

## 2017-07-10 DIAGNOSIS — Z23 Encounter for immunization: Secondary | ICD-10-CM

## 2017-07-10 DIAGNOSIS — M779 Enthesopathy, unspecified: Secondary | ICD-10-CM

## 2017-07-10 DIAGNOSIS — R319 Hematuria, unspecified: Secondary | ICD-10-CM

## 2017-07-10 LAB — POCT URINALYSIS DIPSTICK
Bilirubin, UA: NEGATIVE
GLUCOSE UA: NEGATIVE
Ketones, UA: NEGATIVE
Leukocytes, UA: NEGATIVE
Nitrite, UA: NEGATIVE
PH UA: 6 (ref 5.0–8.0)
PROTEIN UA: NEGATIVE
RBC UA: 3
SPEC GRAV UA: 1.025 (ref 1.010–1.025)
UROBILINOGEN UA: 0.2 U/dL

## 2017-07-10 LAB — URINALYSIS, ROUTINE W REFLEX MICROSCOPIC
Bilirubin Urine: NEGATIVE
KETONES UR: NEGATIVE
Leukocytes, UA: NEGATIVE
Nitrite: NEGATIVE
PH: 6 (ref 5.0–8.0)
SPECIFIC GRAVITY, URINE: 1.02 (ref 1.000–1.030)
TOTAL PROTEIN, URINE-UPE24: NEGATIVE
Urine Glucose: NEGATIVE
Urobilinogen, UA: 0.2 (ref 0.0–1.0)
WBC UA: NONE SEEN (ref 0–?)

## 2017-07-10 MED ORDER — CYCLOBENZAPRINE HCL 10 MG PO TABS: 10.0000 mg | ORAL_TABLET | Freq: Three times a day (TID) | ORAL | 0 refills | Status: DC | PRN

## 2017-07-10 NOTE — Progress Notes (Signed)
Subjective:    Patient ID: Gwendolyn Scott, female    DOB: 1975/12/24, 41 y.o.   MRN: [id number]  HPI  41 year old female who  has a past medical history of GERD (gastroesophageal reflux disease) and Grave's disease (2009). She presents to the office today for the acute complaint of of right sided back pain  X 1 week. The pain is worse at night when laying in bed. The pain is described as " aching".Denies any worsening pain with bending or twisting. She has been using a heating pad, which helps with the pain. Denies an trauma to the area. Pain does radiate across right flank. She denies any hematuria, dysuria, odorous urine. She does endorse frequency  She also complains of right forearm pain x 2 weeks. This pain is described as "sticking and catching." She only has pain in her right forearm with medial and lateral movements of her wrist with Again, she denies any trauma to the area. She does a lot of typing all day. She has not been using any OTC pain medication.   Review of Systems See HPI   Past Medical History:  Diagnosis Date  . GERD (gastroesophageal reflux disease)   . Grave's disease 2009   I-131    Social History   Socioeconomic History  . Marital status: Divorced    Spouse name: Not on file  . Number of children: 0  . Years of education: Not on file  . Highest education level: Not on file  Social Needs  . Financial resource strain: Not on file  . Food insecurity - worry: Not on file  . Food insecurity - inability: Not on file  . Transportation needs - medical: Not on file  . Transportation needs - non-medical: Not on file  Occupational History  . Occupation: Research scientist (physical sciences): [employer]  Tobacco Use  . Smoking status: Never Smoker  . Smokeless tobacco: Never Used  Substance and Sexual Activity  . Alcohol use: No  . Drug use: No  . Sexual activity: Not on file  Other Topics Concern  . Not on file  Social History Narrative   She works as [employer]    Not married    No children    She likes to go to [place]     History reviewed. No pertinent surgical history.  Family History  Problem Relation Age of Onset  . Hypothyroidism Mother   . Hypothyroidism Maternal Grandmother   . Diabetes Maternal Aunt   . Colon cancer Neg Hx     Allergies  Allergen Reactions  . Penicillins Other (See Comments)    Unknown reaction    Current Outpatient Medications on File Prior to Visit  Medication Sig Dispense Refill  . acetaminophen (TYLENOL) 500 MG tablet Take 500 mg by mouth every 6 (six) hours as needed.      [name] levothyroxine (SYNTHROID, LEVOTHROID) 88 MCG tablet Take 1 tablet (88 mcg total) by mouth daily before breakfast. 90 tablet 3  . medroxyPROGESTERone (DEPO-PROVERA) 150 MG/ML injection Inject 150 mg as directed Every 3 months.    [name] omeprazole (PRILOSEC) 40 MG capsule Take 1 capsule (40 mg total) by mouth daily. 90 capsule 0   No current facility-administered medications on file prior to visit.     BP 116/84   Temp 98.2 F (36.8 C) (Oral)   Wt 249 lb (112.9 kg)   BMI 47.05 kg/m        Objective:  Physical Exam  Constitutional: She is oriented to person, place, and time. She appears well-developed and well-nourished. No distress.  Cardiovascular: Normal rate, regular rhythm, normal heart sounds and intact distal pulses. Exam reveals no gallop and no friction rub.  No murmur heard. Pulmonary/Chest: Effort normal and breath sounds normal. No respiratory distress. She has no wheezes. She has no rales. She exhibits no tenderness.  Abdominal: Soft. Bowel sounds are normal. She exhibits no distension and no mass. There is no hepatosplenomegaly, splenomegaly or hepatomegaly. There is no tenderness. There is CVA tenderness. There is no rebound and no guarding.  Musculoskeletal: She exhibits tenderness (right lower back. ). She exhibits no edema or deformity.  Neurological: She is alert and oriented to person, place,  and time.  Negative Phalens and Tinels   Skin: Skin is warm and dry. No rash noted. She is not diaphoretic. No erythema. No pallor.  Psychiatric: She has a normal mood and affect. Her behavior is normal. Judgment and thought content normal.  Nursing note and vitals reviewed.     Assessment & Plan:  1. Tendonitis - Appears as flexor/extendor tendinopathy  - Motrin 600 mg Q8H x 3 days - Rest  2. Acute right-sided low back pain without sciatica - POC urinalysis shows 3+ blood, otherwise negative  - Will get KUB to look for stone but may need more imaging.  - DG Abd 1 View; Future - POC Urinalysis Dipstick - Urinalysis with Reflex Microscopic  3. Need for influenza vaccination  - Flu Vaccine QUAD 6+ mos PF IM (Fluarix Quad PF)  Dorothyann Peng, NP

## 2017-07-10 NOTE — Telephone Encounter (Signed)
Spoke to patient and informed her of her KUB. She does not have any noticeable kidney stones. I am going to send in a muscle relaxer for perceived MSK pain .lookign through her labs she does have a history of unexplained hematuria. I am going to refer her to urology for further evaluation   She is ok with this plan   Follow up if no improvement

## 2017-09-13 ENCOUNTER — Ambulatory Visit (INDEPENDENT_AMBULATORY_CARE_PROVIDER_SITE_OTHER): Payer: 59 | Admitting: *Deleted

## 2017-09-13 DIAGNOSIS — Z3042 Encounter for surveillance of injectable contraceptive: Secondary | ICD-10-CM

## 2017-09-13 MED ORDER — MEDROXYPROGESTERONE ACETATE 150 MG/ML IM SUSP
150.0000 mg | Freq: Once | INTRAMUSCULAR | Status: AC
  Administered 2017-09-13: 150 mg via INTRAMUSCULAR

## 2017-09-13 NOTE — Progress Notes (Signed)
Per orders of Dorothyann Peng, NP, injection of Depo-Provera given by Dorrene German. Last injection 06/28/17-patient is in range for today's injection. Patient tolerated injection well.

## 2017-09-14 ENCOUNTER — Ambulatory Visit: Payer: 59 | Admitting: Adult Health

## 2017-09-14 DIAGNOSIS — Z0289 Encounter for other administrative examinations: Secondary | ICD-10-CM

## 2017-09-17 ENCOUNTER — Other Ambulatory Visit: Payer: Self-pay | Admitting: Obstetrics and Gynecology

## 2017-09-17 DIAGNOSIS — N6489 Other specified disorders of breast: Secondary | ICD-10-CM

## 2017-10-04 ENCOUNTER — Other Ambulatory Visit: Payer: 59

## 2017-10-10 ENCOUNTER — Encounter: Payer: Self-pay | Admitting: Obstetrics and Gynecology

## 2017-11-01 ENCOUNTER — Other Ambulatory Visit: Payer: Self-pay

## 2017-11-01 MED ORDER — LEVOTHYROXINE SODIUM 88 MCG PO TABS: 88.0000 ug | ORAL_TABLET | Freq: Every day | ORAL | 3 refills | Status: AC

## 2017-11-13 ENCOUNTER — Encounter: Payer: Self-pay | Admitting: Adult Health

## 2017-11-15 ENCOUNTER — Encounter: Payer: Self-pay | Admitting: Family Medicine

## 2017-11-15 ENCOUNTER — Encounter: Payer: Self-pay | Admitting: Emergency Medicine

## 2017-11-15 ENCOUNTER — Ambulatory Visit: Payer: 59 | Admitting: Family Medicine

## 2017-11-15 VITALS — BP 122/70 | HR 90

## 2017-11-15 DIAGNOSIS — H6123 Impacted cerumen, bilateral: Secondary | ICD-10-CM | POA: Diagnosis not present

## 2017-11-15 DIAGNOSIS — J309 Allergic rhinitis, unspecified: Secondary | ICD-10-CM

## 2017-11-15 DIAGNOSIS — R448 Other symptoms and signs involving general sensations and perceptions: Secondary | ICD-10-CM

## 2017-11-15 DIAGNOSIS — R6889 Other general symptoms and signs: Secondary | ICD-10-CM

## 2017-11-15 MED ORDER — PREDNISONE 20 MG PO TABS: 20.0000 mg | ORAL_TABLET | Freq: Two times a day (BID) | ORAL | 0 refills | Status: AC

## 2017-11-15 MED ORDER — FLUTICASONE PROPIONATE 50 MCG/ACT NA SUSP: 2.0000 | Freq: Every day | NASAL | 6 refills | Status: AC

## 2017-11-15 MED ORDER — EAR WAX CLEANSING 6.5 % OT KIT: PACK | OTIC | 1 refills | Status: AC

## 2017-11-15 MED ORDER — METHYLPREDNISOLONE SODIUM SUCC 125 MG IJ SOLR
125.0000 mg | Freq: Once | INTRAMUSCULAR | Status: AC
  Administered 2017-11-15: 125 mg via INTRAMUSCULAR

## 2017-11-15 NOTE — Patient Instructions (Signed)
Allergic Rhinitis, Adult Allergic rhinitis is an allergic reaction that affects the mucous membrane inside the nose. It causes sneezing, a runny or stuffy nose, and the feeling of mucus going down the back of the throat (postnasal drip). Allergic rhinitis can be mild to severe. There are two types of allergic rhinitis:  Seasonal. This type is also called hay fever. It happens only during certain seasons.  Perennial. This type can happen at any time of the year.  What are the causes? This condition happens when the body's defense system (immune system) responds to certain harmless substances called allergens as though they were germs.  Seasonal allergic rhinitis is triggered by pollen, which can come from grasses, trees, and weeds. Perennial allergic rhinitis may be caused by:  House dust mites.  Pet dander.  Mold spores.  What are the signs or symptoms? Symptoms of this condition include:  Sneezing.  Runny or stuffy nose (nasal congestion).  Postnasal drip.  Itchy nose.  Tearing of the eyes.  Trouble sleeping.  Daytime sleepiness.  How is this diagnosed? This condition may be diagnosed based on:  Your medical history.  A physical exam.  Tests to check for related conditions, such as: ? Asthma. ? Pink eye. ? Ear infection. ? Upper respiratory infection.  Tests to find out which allergens trigger your symptoms. These may include skin or blood tests.  How is this treated? There is no cure for this condition, but treatment can help control symptoms. Treatment may include:  Taking medicines that block allergy symptoms, such as antihistamines. Medicine may be given as a shot, nasal spray, or pill.  Avoiding the allergen.  Desensitization. This treatment involves getting ongoing shots until your body becomes less sensitive to the allergen. This treatment may be done if other treatments do not help.  If taking medicine and avoiding the allergen does not work, new,  stronger medicines may be prescribed.  Follow these instructions at home:  Find out what you are allergic to. Common allergens include smoke, dust, and pollen.  Avoid the things you are allergic to. These are some things you can do to help avoid allergens: ? Replace carpet with wood, tile, or vinyl flooring. Carpet can trap dander and dust. ? Do not smoke. Do not allow smoking in your home. ? Change your heating and air conditioning filter at least once a month. ? During allergy season:  Keep windows closed as much as possible.  Plan outdoor activities when pollen counts are lowest. This is usually during the evening hours.  When coming indoors, change clothing and shower before sitting on furniture or bedding.  Take over-the-counter and prescription medicines only as told by your health care provider.  Keep all follow-up visits as told by your health care provider. This is important. Contact a health care provider if:  You have a fever.  You develop a persistent cough.  You make whistling sounds when you breathe (you wheeze).  Your symptoms interfere with your normal daily activities. Get help right away if:  You have shortness of breath. Summary  This condition can be managed by taking medicines as directed and avoiding allergens.  Contact your health care provider if you develop a persistent cough or fever.  During allergy season, keep windows closed as much as possible. This information is not intended to replace advice given to you by your health care provider. Make sure you discuss any questions you have with your health care provider. Document Released: 05/09/2001 Document Revised: 09/21/2016  Document Reviewed: 09/21/2016 Elsevier Interactive Patient Education  2018 [place], Adult Your doctor has found that you have a condition that requires you to use ear drops. Ear drops are a medicine that is placed in the ear. This sheet gives you information  about how to use this medicine. Your doctor may also give you more specific instructions. Supplies needed:  Cotton ball.  Medicine. How to put ear drops into your ear 1. Wash your hands with soap and water. 2. Make sure your ears are clean and dry. 3. Warm the medicine by holding it in your hand for a few minutes. 4. Shake the medicine to mix the ear drops. 5. Use the tube to get the medicine. You will need to squeeze the round part of the tube to do this. 6. Put the drops in your ear as told. Hold the tube above your ear. Do not let the tube touch your ear. The medicine may go in easier if you pull the flap of your ear up and back while you put the drops in. 7. To make sure your ear soaks up the medicine, do one of these things: ? Lie down for 10 minutes. The ear with the medicine should face up. ? Put a cotton ball in your ear. Do not push it deeper into your ear. Take out the cotton ball when the drops have been soaked up. 8. If you need to put drops in your other ear, repeat the same steps. Your doctor will tell you if you should put drops in both ears. Follow these instructions at home:  Use the ear drops for as long as your doctor tells you to. Do not stop even if your symptoms get better.  Keep the ear drops at room temperature.  Keep all follow-up visits as told by your doctor. This is important. Contact a doctor if:  Your condition gets worse.  Your pain gets worse.  Unusual fluid (drainage) is coming from your ear, especially if the fluid stinks.  You have trouble hearing. Get help right away if:  You feel like the room is spinning and you feel sick to your stomach. This condition is called vertigo.  The outside of your ear becomes red or swollen.  You have a very bad headache. Summary  Ear drops are a medicine that is put in the ear.  Put the drops in your ear as told by your doctor.  Use the ear drops for as long as your doctor tells you to. Do not stop even  if your symptoms get better. This information is not intended to replace advice given to you by your health care provider. Make sure you discuss any questions you have with your health care provider. Document Released: 02/01/2010 Document Revised: 08/18/2016 Document Reviewed: 08/18/2016 Elsevier Interactive Patient Education  2017 [place], Adult Your doctor has found that you have a condition that requires you to use ear drops. Ear drops are a medicine that is placed in the ear. This sheet gives you information about how to use this medicine. Your doctor may also give you more specific instructions. Supplies needed:  Cotton ball.  Medicine. How to put ear drops into your ear 9. Wash your hands with soap and water. 10. Make sure your ears are clean and dry. 11. Warm the medicine by holding it in your hand for a few minutes. 12. Shake the medicine to mix the ear drops. 13. Use the tube  to get the medicine. You will need to squeeze the round part of the tube to do this. 14. Put the drops in your ear as told. Hold the tube above your ear. Do not let the tube touch your ear. The medicine may go in easier if you pull the flap of your ear up and back while you put the drops in. 15. To make sure your ear soaks up the medicine, do one of these things: ? Lie down for 10 minutes. The ear with the medicine should face up. ? Put a cotton ball in your ear. Do not push it deeper into your ear. Take out the cotton ball when the drops have been soaked up. 16. If you need to put drops in your other ear, repeat the same steps. Your doctor will tell you if you should put drops in both ears. Follow these instructions at home:  Use the ear drops for as long as your doctor tells you to. Do not stop even if your symptoms get better.  Keep the ear drops at room temperature.  Keep all follow-up visits as told by your doctor. This is important. Contact a doctor if:  Your condition gets  worse.  Your pain gets worse.  Unusual fluid (drainage) is coming from your ear, especially if the fluid stinks.  You have trouble hearing. Get help right away if:  You feel like the room is spinning and you feel sick to your stomach. This condition is called vertigo.  The outside of your ear becomes red or swollen.  You have a very bad headache. Summary  Ear drops are a medicine that is put in the ear.  Put the drops in your ear as told by your doctor.  Use the ear drops for as long as your doctor tells you to. Do not stop even if your symptoms get better. This information is not intended to replace advice given to you by your health care provider. Make sure you discuss any questions you have with your health care provider. Document Released: 02/01/2010 Document Revised: 08/18/2016 Document Reviewed: 08/18/2016 Elsevier Interactive Patient Education  2017 Elsevier Inc. Fluticasone nasal spray What is this medicine? FLUTICASONE (floo TIK a sone) is a corticosteroid. This medicine is used to treat the symptoms of allergies like sneezing, itchy red eyes, and itchy, runny, or stuffy nose. This medicine is also used to treat nasal polyps. This medicine may be used for other purposes; ask your health care provider or pharmacist if you have questions. COMMON BRAND NAME(S): Flonase, Flonase Allergy Relief, Flonase Sensimist, Veramyst, XHANCE What should I tell my health care provider before I take this medicine? They need to know if you have any of these conditions: -cataracts -glaucoma -infection, like tuberculosis, herpes, or fungal infection -recent surgery on nose or sinuses -taking a corticosteroid by mouth -an unusual or allergic reaction to fluticasone, steroids, other medicines, foods, dyes, or preservatives -pregnant or trying to get pregnant -breast-feeding How should I use this medicine? This medicine is for use in the nose. Follow the directions on your product or  prescription label. This medicine works best if used at regular intervals. Do not use more often than directed. Make sure that you are using your nasal spray correctly. After 6 months of daily use for allergies, talk to your doctor or health care professional before using it for a longer time. Ask your doctor or health care professional if you have any questions. Talk to your pediatrician regarding the use  of this medicine in children. Special care may be needed. Some products have been used for allergies in children as young as 2 years. After 2 months of daily use without a prescription in a child, talk to your pediatrician before using it for a longer time. Use of this medicine for nasal polyps is not approved in children. Overdosage: If you think you have taken too much of this medicine contact a poison control center or emergency room at once. NOTE: This medicine is only for you. Do not share this medicine with others. What if I miss a dose? If you miss a dose, use it as soon as you remember. If it is almost time for your next dose, use only that dose and continue with your regular schedule. Do not use double or extra doses. What may interact with this medicine? -certain antibiotics like clarithromycin and telithromycin -certain medicines for fungal infections like ketoconazole, itraconazole, and voriconazole -conivaptan -nefazodone -some medicines for HIV -vaccines This list may not describe all possible interactions. Give your health care provider a list of all the medicines, herbs, non-prescription drugs, or dietary supplements you use. Also tell them if you smoke, drink alcohol, or use illegal drugs. Some items may interact with your medicine. What should I watch for while using this medicine? Visit your doctor or health care professional for regular checks on your progress. Some symptoms may improve within 12 hours after starting use. Check with your doctor or health care professional if  there is no improvement in your symptoms after 3 weeks of use. This medicine may increase your risk of getting an infection. Tell your doctor or health care professional if you are around anyone with measles or chickenpox, or if you develop sores or blisters that do not heal properly. What side effects may I notice from receiving this medicine? Side effects that you should report to your doctor or health care professional as soon as possible: -allergic reactions like skin rash, itching or hives, swelling of the face, lips, or tongue -changes in vision -crusting or sores in the nose -nosebleed -signs and symptoms of infection like fever or chills; cough; sore throat -white patches or sores in the mouth or nose Side effects that usually do not require medical attention (report to your doctor or health care professional if they continue or are bothersome): -burning or irritation inside the nose or throat -cough -headache -unusual taste or smell This list may not describe all possible side effects. Call your doctor for medical advice about side effects. You may report side effects to FDA at 1-800-FDA-1088. Where should I keep my medicine? Keep out of the reach of children. Store at room temperature between 15 and 30 degrees C (59 and 86 degrees F). Avoid exposure to extreme heat, cold, or light. Throw away any unused medicine after the expiration date. NOTE: This sheet is a summary. It may not cover all possible information. If you have questions about this medicine, talk to your doctor, pharmacist, or health care provider.  2018 Elsevier/Gold Standard (2016-05-26 14:23:12)

## 2017-11-15 NOTE — Progress Notes (Signed)
Subjective:  Patient ID: Gwendolyn Scott, female    DOB: [birth year]  Age: 42 y.o. MRN: [id number]  CC: Acute Visit   HPI Gwendolyn Scott presents for  evaluation of a 3-4-day history of acute facial pain and pressure that is worse on the right side.  She has noticed some clear postnasal drip but has no rhinorrhea.  There is no teeth pain.  No fevers chills nausea vomiting.  No malaise myalgias or arthralgias.  Of the last few weeks she has been experienced some sneezing and clear postnasal drip.  Outpatient Medications Prior to Visit  Medication Sig Dispense Refill  . acetaminophen (TYLENOL) 500 MG tablet Take 500 mg by mouth every 6 (six) hours as needed.      [name] levothyroxine (SYNTHROID, LEVOTHROID) 88 MCG tablet Take 1 tablet (88 mcg total) by mouth daily before breakfast. 90 tablet 3  . medroxyPROGESTERone (DEPO-PROVERA) 150 MG/ML injection Inject 150 mg as directed Every 3 months.    [name] omeprazole (PRILOSEC) 40 MG capsule Take 1 capsule (40 mg total) by mouth daily. 90 capsule 0  . cyclobenzaprine (FLEXERIL) 10 MG tablet Take 1 tablet (10 mg total) 3 (three) times daily as needed by mouth for muscle spasms. 30 tablet 0   No facility-administered medications prior to visit.     ROS Review of Systems  Constitutional: Negative for chills, fatigue and unexpected weight change.  HENT: Positive for congestion and postnasal drip. Negative for ear discharge, ear pain, hearing loss, rhinorrhea, sinus pressure and sinus pain.   Eyes: Negative for photophobia and visual disturbance.  Respiratory: Negative for cough, chest tightness and wheezing.   Cardiovascular: Negative.   Gastrointestinal: Negative.   Endocrine: Negative for polyphagia and polyuria.  Genitourinary: Negative.   Musculoskeletal: Negative for arthralgias and myalgias.  Skin: Negative for color change and rash.  Allergic/Immunologic: Negative for immunocompromised state.  Neurological: Negative for weakness,  light-headedness and headaches.  Hematological: Does not bruise/bleed easily.  Psychiatric/Behavioral: Negative.     Objective:  BP 122/70 (BP Location: Left Arm, Patient Position: Sitting, Cuff Size: Large)   Pulse 90   BP Readings from Last 3 Encounters:  11/15/17 122/70  07/10/17 116/84  04/18/17 120/78    Wt Readings from Last 3 Encounters:  07/10/17 249 lb (112.9 kg)  04/18/17 250 lb 6.4 oz (113.6 kg)  03/16/17 249 lb (112.9 kg)    Physical Exam  Constitutional: She is oriented to person, place, and time. She appears well-developed and well-nourished. No distress.  HENT:  Head: Normocephalic and atraumatic.    Right Ear: External ear normal. A foreign body is present.  Left Ear: External ear normal. A foreign body is present.  Ears:  Mouth/Throat: Uvula is midline, oropharynx is clear and moist and mucous membranes are normal. Normal dentition. No dental abscesses. No oropharyngeal exudate.  Eyes: Pupils are equal, round, and reactive to light. Conjunctivae are normal. Right eye exhibits no discharge. Left eye exhibits no discharge. No scleral icterus.  Neck: Neck supple. No JVD present. No tracheal deviation present. No thyromegaly present.  Cardiovascular: Normal rate, regular rhythm and normal heart sounds.  Pulmonary/Chest: Effort normal and breath sounds normal. No stridor.  Lymphadenopathy:    She has no cervical adenopathy.  Neurological: She is alert and oriented to person, place, and time.  Skin: Skin is warm and dry. She is not diaphoretic.  Psychiatric: She has a normal mood and affect. Her behavior is normal.    Lab Results  Component Value Date  WBC 9.8 10/05/2016   HGB 12.2 10/05/2016   HCT 37.5 10/05/2016   PLT 349.0 10/05/2016   GLUCOSE 98 10/05/2016   CHOL 137 10/05/2016   TRIG 46.0 10/05/2016   HDL 39.60 10/05/2016   LDLCALC 88 10/05/2016   ALT 9 10/05/2016   AST 11 10/05/2016   NA 142 10/05/2016   K 4.4 10/05/2016   CL 106 10/05/2016     CREATININE 0.73 10/05/2016   BUN 9 10/05/2016   CO2 30 10/05/2016   TSH 6.50 (H) 04/18/2017   HGBA1C 5.2 03/02/2009    Dg Abd 1 View  Result Date: 07/10/2017 CLINICAL DATA:  One week of right-sided abdominal and flank pain. EXAM: ABDOMEN - 1 VIEW COMPARISON:  None in PACs FINDINGS: The colonic stool burden is moderately increased. There is no small or large bowel obstructive pattern. No abnormal calcifications project over either kidney. There coarse calcifications in the left aspect of the pelvis which are likely phleboliths. The bony structures are unremarkable. IMPRESSION: Moderately increased colonic stool burden may reflect constipation in the appropriate clinical setting. No calcified urinary tract stones are observed. No acute intra-abdominal abnormality is observed. Electronically Signed   By: David  Martinique M.D.   On: 07/10/2017 09:20    Assessment & Plan:   Jadda was seen today for acute visit.  Diagnoses and all orders for this visit:  Facial pressure -     methylPREDNISolone sodium succinate (SOLU-MEDROL) 125 mg/2 mL injection 125 mg -     predniSONE (DELTASONE) 20 MG tablet; Take 1 tablet (20 mg total) by mouth 2 (two) times daily with a meal for 7 days. Start tomorrow.  Allergic rhinitis, unspecified seasonality, unspecified trigger -     methylPREDNISolone sodium succinate (SOLU-MEDROL) 125 mg/2 mL injection 125 mg -     predniSONE (DELTASONE) 20 MG tablet; Take 1 tablet (20 mg total) by mouth 2 (two) times daily with a meal for 7 days. Start tomorrow. -     fluticasone (FLONASE) 50 MCG/ACT nasal spray; Place 2 sprays into both nostrils daily.  Ceruminosis, bilateral -     Carbamide Peroxide-Saline (EAR WAX CLEANSING) 6.5 % KIT; Follow kit instructions   I have discontinued [name]'s cyclobenzaprine. I am also having her start on predniSONE, fluticasone, and EAR WAX CLEANSING. Additionally, I am having her maintain her acetaminophen, medroxyPROGESTERone,  omeprazole, and levothyroxine. We will continue to administer methylPREDNISolone sodium succinate.  Meds ordered this encounter  Medications  . methylPREDNISolone sodium succinate (SOLU-MEDROL) 125 mg/2 mL injection 125 mg  . predniSONE (DELTASONE) 20 MG tablet    Sig: Take 1 tablet (20 mg total) by mouth 2 (two) times daily with a meal for 7 days. Start tomorrow.    Dispense:  14 tablet    Refill:  0  . fluticasone (FLONASE) 50 MCG/ACT nasal spray    Sig: Place 2 sprays into both nostrils daily.    Dispense:  16 g    Refill:  6  . Carbamide Peroxide-Saline (EAR WAX CLEANSING) 6.5 % KIT    Sig: Follow kit instructions    Dispense:  1 kit    Refill:  1     Follow-up: Return in about 1 week (around 11/22/2017), or if symptoms worsen or fail to improve.  Libby Maw, MD

## 2017-11-18 ENCOUNTER — Emergency Department (HOSPITAL_BASED_OUTPATIENT_CLINIC_OR_DEPARTMENT_OTHER): Payer: 59

## 2017-11-18 ENCOUNTER — Other Ambulatory Visit: Payer: Self-pay

## 2017-11-18 ENCOUNTER — Emergency Department (HOSPITAL_BASED_OUTPATIENT_CLINIC_OR_DEPARTMENT_OTHER)
Admission: EM | Admit: 2017-11-18 | Discharge: 2017-11-18 | Disposition: A | Discharge status: Home / Self Care | Payer: 59 | Attending: Emergency Medicine | Admitting: Emergency Medicine

## 2017-11-18 ENCOUNTER — Encounter (HOSPITAL_BASED_OUTPATIENT_CLINIC_OR_DEPARTMENT_OTHER): Payer: Self-pay | Admitting: Emergency Medicine

## 2017-11-18 DIAGNOSIS — R109 Unspecified abdominal pain: Secondary | ICD-10-CM | POA: Insufficient documentation

## 2017-11-18 DIAGNOSIS — E038 Other specified hypothyroidism: Secondary | ICD-10-CM | POA: Insufficient documentation

## 2017-11-18 DIAGNOSIS — Z79899 Other long term (current) drug therapy: Secondary | ICD-10-CM | POA: Diagnosis not present

## 2017-11-18 LAB — CBC WITH DIFFERENTIAL/PLATELET
Basophils Absolute: 0 10*3/uL (ref 0.0–0.1)
Basophils Relative: 0 %
EOS ABS: 0.1 10*3/uL (ref 0.0–0.7)
EOS PCT: 1 %
HCT: 34.8 % — ABNORMAL LOW (ref 36.0–46.0)
Hemoglobin: 11.1 g/dL — ABNORMAL LOW (ref 12.0–15.0)
LYMPHS ABS: 4 10*3/uL (ref 0.7–4.0)
Lymphocytes Relative: 37 %
MCH: 23.6 pg — AB (ref 26.0–34.0)
MCHC: 31.9 g/dL (ref 30.0–36.0)
MCV: 73.9 fL — ABNORMAL LOW (ref 78.0–100.0)
MONOS PCT: 8 %
Monocytes Absolute: 0.8 10*3/uL (ref 0.1–1.0)
Neutro Abs: 5.8 10*3/uL (ref 1.7–7.7)
Neutrophils Relative %: 54 %
PLATELETS: 353 10*3/uL (ref 150–400)
RBC: 4.71 MIL/uL (ref 3.87–5.11)
RDW: 18.6 % — AB (ref 11.5–15.5)
WBC: 10.8 10*3/uL — ABNORMAL HIGH (ref 4.0–10.5)

## 2017-11-18 LAB — URINALYSIS, ROUTINE W REFLEX MICROSCOPIC
Bilirubin Urine: NEGATIVE
Glucose, UA: NEGATIVE mg/dL
Ketones, ur: NEGATIVE mg/dL
Leukocytes, UA: NEGATIVE
Nitrite: NEGATIVE
PROTEIN: NEGATIVE mg/dL
Specific Gravity, Urine: 1.02 (ref 1.005–1.030)
pH: 5.5 (ref 5.0–8.0)

## 2017-11-18 LAB — PREGNANCY, URINE: PREG TEST UR: NEGATIVE

## 2017-11-18 LAB — COMPREHENSIVE METABOLIC PANEL
ALT: 10 U/L — ABNORMAL LOW (ref 14–54)
ANION GAP: 9 (ref 5–15)
AST: 14 U/L — ABNORMAL LOW (ref 15–41)
Albumin: 3.3 g/dL — ABNORMAL LOW (ref 3.5–5.0)
Alkaline Phosphatase: 63 U/L (ref 38–126)
BUN: 10 mg/dL (ref 6–20)
CHLORIDE: 106 mmol/L (ref 101–111)
CO2: 23 mmol/L (ref 22–32)
Calcium: 8.8 mg/dL — ABNORMAL LOW (ref 8.9–10.3)
Creatinine, Ser: 0.79 mg/dL (ref 0.44–1.00)
GFR calc non Af Amer: >60 mL/min (ref >60)
Glucose, Bld: 80 mg/dL (ref 65–99)
POTASSIUM: 3.4 mmol/L — AB (ref 3.5–5.1)
SODIUM: 138 mmol/L (ref 135–145)
Total Bilirubin: 0.7 mg/dL (ref 0.3–1.2)
Total Protein: 7.4 g/dL (ref 6.5–8.1)

## 2017-11-18 LAB — URINALYSIS, MICROSCOPIC (REFLEX): WBC, UA: NONE SEEN WBC/hpf (ref 0–5)

## 2017-11-18 MED ORDER — CIPROFLOXACIN HCL 500 MG PO TABS: 500.0000 mg | ORAL_TABLET | Freq: Two times a day (BID) | ORAL | 0 refills | Status: AC

## 2017-11-18 MED ORDER — KETOROLAC TROMETHAMINE 30 MG/ML IJ SOLN
15.0000 mg | Freq: Once | INTRAMUSCULAR | Status: AC
  Administered 2017-11-18: 15 mg via INTRAMUSCULAR
  Filled 2017-11-18: qty 1

## 2017-11-18 MED ORDER — SODIUM CHLORIDE 0.9 % IV BOLUS (SEPSIS): 1000.0000 mL | Freq: Once | INTRAVENOUS | Status: DC

## 2017-11-18 MED ORDER — TRAMADOL HCL 50 MG PO TABS: 50.0000 mg | ORAL_TABLET | Freq: Four times a day (QID) | ORAL | 0 refills | Status: AC | PRN

## 2017-11-18 NOTE — ED Notes (Signed)
Patient transported to CT 

## 2017-11-18 NOTE — Discharge Instructions (Addendum)
You have been seen today for your complaint of pain with urination. Your lab work showed urine infection. Your discharge medications include: 1) ciprofloxacin Please take all of your antibiotics until finished!   You may develop abdominal discomfort or diarrhea from the antibiotic.  You may help offset this with probiotics which you can buy or get in yogurt. Do not eat  or take the probiotics until 2 hours after your antibiotic.  Home care instructions are as follows:  1) please drink plenty of water, avoid tea and beverages with caffeine like coffee or soda 2) if you are sexually active, ,make sure to urinate immediately after intercourse Follow up with: your doctor or the emergency department Please seek immediate medical care if you develop any of the following symptoms: SEEK MEDICAL CARE IF:  You have back pain.  You develop a fever.  Your symptoms do not begin to resolve within 3 days.  SEEK IMMEDIATE MEDICAL CARE IF:  You have severe back pain or lower abdominal pain.  You develop chills.  You have nausea or vomiting.  You have continued burning or discomfort with urination.

## 2017-11-18 NOTE — ED Provider Notes (Signed)
[place] EMERGENCY DEPARTMENT Provider Note   CSN: [id number] Arrival date & time: 11/18/17  1150     History   Chief Complaint Chief Complaint  Patient presents with  . Flank Pain    HPI Gwendolyn Scott is a 42 y.o. female.  HPI 42 year old African-American female with no pertinent past medical history presents to the emergency department today for evaluation of right flank pain.  Patient states that for the past week she is been having increasing right flank pain with associated dysuria, urgency and frequency.  Denies any hematuria.  Patient states the pain is intermittent dull ache.  The pain does not radiate to the abdomen.  Denies any associated nausea, vomiting, fevers or chills.  Nothing makes her symptoms better or worse.  She is not take anything for her symptoms prior to arrival.  Patient denies any associated vaginal bleeding or discharge.  Pt denies any fever, chill, ha, vision changes, lightheadedness, dizziness, congestion, neck pain, cp, sob, cough, abd pain, n/v/d,  change in bowel habits, melena, hematochezia, lower extremity paresthesias.  Past Medical History:  Diagnosis Date  . GERD (gastroesophageal reflux disease)   . Grave's disease 2009   I-[id number]    Patient Active Problem List   Diagnosis Date Noted  . Facial pressure 11/15/2017  . Allergic rhinitis 11/15/2017  . Ceruminosis, bilateral 11/15/2017  . Carpal tunnel syndrome 12/18/2014  . 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 Amenorrhea 10/06/2013  . GERD 04/27/2010    History reviewed. No pertinent surgical history.   OB History   None      Home Medications    Prior to Admission medications   Medication Sig Start Date End Date Taking? Authorizing Provider  acetaminophen (TYLENOL) 500 MG tablet Take 500 mg by mouth every 6 (six) hours as needed.      [provider]  Carbamide Peroxide-Saline (EAR WAX CLEANSING) 6.5 % KIT Follow kit instructions  11/15/17   Libby Maw, MD  ciprofloxacin (CIPRO) 500 MG tablet Take 1 tablet (500 mg total) by mouth 2 (two) times daily. 11/18/17   Doristine Devoid, PA-C  fluticasone (FLONASE) 50 MCG/ACT nasal spray Place 2 sprays into both nostrils daily. 11/15/17   Libby Maw, MD  levothyroxine (SYNTHROID, LEVOTHROID) 88 MCG tablet Take 1 tablet (88 mcg total) by mouth daily before breakfast. 11/01/17   Renato Shin, MD  medroxyPROGESTERone (DEPO-PROVERA) 150 MG/ML injection Inject 150 mg as directed Every 3 months. 08/30/11   [provider]  omeprazole (PRILOSEC) 40 MG capsule Take 1 capsule (40 mg total) by mouth daily. 03/06/17   Nafziger, Tommi Rumps, NP  predniSONE (DELTASONE) 20 MG tablet Take 1 tablet (20 mg total) by mouth 2 (two) times daily with a meal for 7 days. Start tomorrow. 11/15/17 11/22/17  Libby Maw, MD  traMADol (ULTRAM) 50 MG tablet Take 1 tablet (50 mg total) by mouth every 6 (six) hours as needed. 11/18/17   Doristine Devoid, PA-C    Family History Family History  Problem Relation Age of Onset  . Hypothyroidism Mother   . Hypothyroidism Maternal Grandmother   . Diabetes Maternal Aunt   . Colon cancer Neg Hx     Social History Social History   Tobacco Use  . Smoking status: Never Smoker  . Smokeless tobacco: Never Used  Substance Use Topics  . Alcohol use: No  . Drug use: No     Allergies   Penicillins   Review of Systems Review of Systems  All other systems reviewed and are negative.    Physical Exam Updated Vital Signs BP 112/81   Pulse 72   Temp 98.6 F (37 C) (Oral)   Resp 16   Ht _0  (1.6 m)   Wt 112.5 kg (248 lb)   SpO2 98%   BMI 43.93 kg/m   Physical Exam  Constitutional: She is oriented to person, place, and time. She appears well-developed and well-nourished.  Non-toxic appearance. No distress.  HENT:  Head: Normocephalic and atraumatic.  Nose: Nose normal.  Mouth/Throat: Oropharynx is clear and moist.   Eyes: Pupils are equal, round, and reactive to light. Conjunctivae are normal. Right eye exhibits no discharge. Left eye exhibits no discharge.  Neck: Normal range of motion. Neck supple.  Cardiovascular: Normal rate, regular rhythm, normal heart sounds and intact distal pulses. Exam reveals no gallop and no friction rub.  No murmur heard. Pulmonary/Chest: Effort normal and breath sounds normal. No stridor. No respiratory distress. She has no wheezes. She has no rales. She exhibits no tenderness.  Abdominal: Soft. Normal appearance and bowel sounds are normal. There is no tenderness. There is CVA tenderness (right). There is no rebound, no guarding and no tenderness at McBurney's point.  Musculoskeletal: Normal range of motion. She exhibits no tenderness.  Lymphadenopathy:    She has no cervical adenopathy.  Neurological: She is alert and oriented to person, place, and time.  Skin: Skin is warm and dry. Capillary refill takes less than 2 seconds.  Psychiatric: Her behavior is normal. Judgment and thought content normal.  Nursing note and vitals reviewed.    ED Treatments / Results  Labs (all labs ordered are listed, but only abnormal results are displayed) Labs Reviewed  URINALYSIS, ROUTINE W REFLEX MICROSCOPIC - Abnormal; Notable for the following components:      Result Value   Hgb urine dipstick LARGE (*)    All other components within normal limits  URINALYSIS, MICROSCOPIC (REFLEX) - Abnormal; Notable for the following components:   Bacteria, UA MANY (*)    Squamous Epithelial / LPF 0-5 (*)    All other components within normal limits  CBC WITH DIFFERENTIAL/PLATELET - Abnormal; Notable for the following components:   WBC 10.8 (*)    Hemoglobin 11.1 (*)    HCT 34.8 (*)    MCV 73.9 (*)    MCH 23.6 (*)    RDW 18.6 (*)    All other components within normal limits  COMPREHENSIVE METABOLIC PANEL - Abnormal; Notable for the following components:   Potassium 3.4 (*)    Calcium 8.8  (*)    Albumin 3.3 (*)    AST 14 (*)    ALT 10 (*)    All other components within normal limits  PREGNANCY, URINE    EKG None  Radiology Ct Renal Stone Study  Result Date: 11/18/2017 CLINICAL DATA:  Right flank pain for 1 week with dysuria. EXAM: CT ABDOMEN AND PELVIS WITHOUT CONTRAST TECHNIQUE: Multidetector CT imaging of the abdomen and pelvis was performed following the standard protocol without IV contrast. COMPARISON:  Abdominal radiographs 07/10/2017 FINDINGS: Lower chest: Linear atelectasis or scarring in the right lower lobe. The lung bases are otherwise clear. No significant pleural or pericardial effusion. Hepatobiliary: The liver demonstrates decreased density consistent with steatosis. No focal abnormalities are seen on noncontrast imaging. No evidence of gallstones, gallbladder wall thickening or biliary dilatation. Pancreas: Unremarkable. No pancreatic ductal dilatation or surrounding inflammatory changes. Spleen: Normal in size without focal abnormality. Adrenals/Urinary Tract:  Both adrenal glands appear normal. Both kidneys appear normal. No evidence of urinary tract calculus, hydronephrosis or perinephric soft tissue stranding. The bladder appears normal. Stomach/Bowel: No evidence of bowel wall thickening, distention or surrounding inflammatory change. The appendix appears normal. Vascular/Lymphatic: There are no enlarged abdominal or pelvic lymph nodes. There are few small scattered retroperitoneal, mesenteric and pelvic lymph nodes. No significant vascular findings on noncontrast imaging. Reproductive: The uterus and ovaries appear normal. No adnexal mass. Other: No evidence of abdominal wall mass or hernia. No ascites. Musculoskeletal: No acute or significant osseous findings. IMPRESSION: 1. No acute findings or explanation for the patient's symptoms. No evidence of urinary tract calculus or hydronephrosis. 2. Hepatic steatosis. Electronically Signed   By: Richardean Sale M.D.    On: 11/18/2017 14:45    Procedures Procedures (including critical care time)  Medications Ordered in ED Medications  ketorolac (TORADOL) 30 MG/ML injection 15 mg (has no administration in time range)     Initial Impression / Assessment and Plan / ED Course  I have reviewed the triage vital signs and the nursing notes.  Pertinent labs & imaging results that were available during my care of the patient were reviewed by me and considered in my medical decision making (see chart for details).     Patient presents to the emergency department today for evaluation of right flank pain and urinary symptoms including dysuria, urgency and frequency.  Patient states that her symptoms started 1 week ago have progressively worsened.  Denies any associated fevers, nausea, vomiting.  Denies any change in bowel habits, vaginal symptoms.  Exam patient overall well-appearing and nontoxic.  Her vital signs are reassuring.  Patient is afebrile, no tachycardia or hypotension is noted.  Has no focal abdominal tenderness to palpation.  Does have right-sided CVA tenderness.  Heart regular rate and rhythm.  Lungs clear to auscultation bilaterally.  Lab work reveals a mild leukocytosis.  She also has anemia with history of the same and at patient's baseline.  Urine pregnancy test was negative.  Liver enzymes were normal.  Normal kidney function.  UA shows large hemoglobin, many bacteria.  Given patient's right flank pain and urinary symptoms concern for possible pyelonephritis.  The patient was sent for CT renal stone study to rule out any signs of hydronephrosis, stranding or ureteral stone.  CT scan was unremarkable except for hepatic steatosis.  Patient is nontoxic or septic.[name]  She does have a history of penicillin allergy however she does not know her allergy.  Patient has never taken cephalosporins.  Spoke with pharmacy.  They have no indication that patient has taken any cephalosporins.  I discussed with  pharmacy second best antibiotic for pyelonephritis.  They recommended ciprofloxacin.  Was discussed with patient risk for benefit of this medication.  Patient will take this for 7 days as indicated.  Have given her a short course of pain medication.  Urged follow-up with primary care doctor if symptoms persist and have given her strict return precautions.  Pt is hemodynamically stable, in NAD, & able to ambulate in the ED. Evaluation does not show pathology that would require ongoing emergent intervention or inpatient treatment. I explained the diagnosis to the patient. Pain has been managed & has no complaints prior to dc. Pt is comfortable with above plan and is stable for discharge at this time. All questions were answered prior to disposition. Strict return precautions for f/u to the ED were discussed. Encouraged follow up with PCP.   Final Clinical Impressions(s) /  ED Diagnoses   Final diagnoses:  Right flank pain    ED Discharge Orders        Ordered    ciprofloxacin (CIPRO) 500 MG tablet  2 times daily     11/18/17 1504    traMADol (ULTRAM) 50 MG tablet  Every 6 hours PRN     11/18/17 1504       Doristine Devoid, PA-C 11/18/17 1510    Lajean Saver, MD 11/18/17 1527

## 2017-11-18 NOTE — ED Notes (Signed)
ED Provider at bedside. 

## 2017-11-18 NOTE — ED Triage Notes (Signed)
R flank pain x 1 week with dysuria. Denies N/V

## 2017-11-20 ENCOUNTER — Ambulatory Visit: Payer: 59 | Admitting: Adult Health

## 2017-11-29 ENCOUNTER — Ambulatory Visit (INDEPENDENT_AMBULATORY_CARE_PROVIDER_SITE_OTHER): Payer: 59 | Admitting: Family Medicine

## 2017-11-29 DIAGNOSIS — Z308 Encounter for other contraceptive management: Secondary | ICD-10-CM | POA: Diagnosis not present

## 2017-11-29 MED ORDER — MEDROXYPROGESTERONE ACETATE 150 MG/ML IM SUSP
150.0000 mg | Freq: Once | INTRAMUSCULAR | Status: AC
  Administered 2017-11-29: 150 mg via INTRAMUSCULAR

## 2017-11-29 NOTE — Progress Notes (Signed)
Per orders of NP [employer], injection of Depo Provera 150 mg given by Kathie Dike, Linzie Boursiquot ANN. Patient tolerated injection well.

## 2017-12-03 ENCOUNTER — Telehealth: Payer: Self-pay | Admitting: Adult Health

## 2017-12-03 NOTE — Telephone Encounter (Signed)
Copied from [place] [id number]. Topic: Quick Communication - See Telephone Encounter >> Dec 03, 2017  9:47 AM Hewitt Shorts wrote: CRM for notification. See Telephone encounter for: 12/03/17.pt is calling to see Dr. Dorothyann Peng can put in orders that her OBGYN office is wanting her to have vitamin b and hemaglobin electrothoresis -she has a hard time getting into the office with her work hours and she is need to have it put in that she can go to [facility] to have them drawn  Best number [phone]

## 2017-12-04 ENCOUNTER — Other Ambulatory Visit (INDEPENDENT_AMBULATORY_CARE_PROVIDER_SITE_OTHER): Payer: 59

## 2017-12-04 ENCOUNTER — Other Ambulatory Visit: Payer: Self-pay | Admitting: Adult Health

## 2017-12-04 DIAGNOSIS — D573 Sickle-cell trait: Secondary | ICD-10-CM

## 2017-12-04 DIAGNOSIS — E559 Vitamin D deficiency, unspecified: Secondary | ICD-10-CM | POA: Diagnosis not present

## 2017-12-04 LAB — VITAMIN D 25 HYDROXY (VIT D DEFICIENCY, FRACTURES): VITD: 25.39 ng/mL — ABNORMAL LOW (ref 30.00–100.00)

## 2017-12-04 NOTE — Telephone Encounter (Signed)
What is GYN looking for ?

## 2017-12-04 NOTE — Telephone Encounter (Signed)
Spoke to the pt.  She had low Vitamin D in the past so gynecologist had her take 50,000 units once weekly for 4 or 5 weeks.  Hemoglobin was ordered to see if she is anemic and because she has sickle cell trait.  Please advise.

## 2017-12-04 NOTE — Addendum Note (Signed)
Addended by: Trenda Moots on: 03/30/7792 11:25 AM   Modules accepted: Orders

## 2017-12-04 NOTE — Telephone Encounter (Signed)
Notified pt that lab orders placed.  No further action required.

## 2017-12-04 NOTE — Telephone Encounter (Signed)
Labs placed.

## 2017-12-06 LAB — HEMOGLOBINOPATHY EVALUATION
HGB C: 0 %
HGB S: 35.1 % — AB
HGB VARIANT: 0 %
Hemoglobin A2 Quantitation: 3.3 % — ABNORMAL HIGH (ref 1.8–3.2)
Hemoglobin F Quantitation: 1.2 % (ref 0.0–2.0)
Hgb A: 60.4 % — ABNORMAL LOW (ref 96.4–98.8)

## 2017-12-17 ENCOUNTER — Encounter: Payer: Self-pay | Admitting: Adult Health

## 2018-01-27 ENCOUNTER — Encounter: Payer: Self-pay | Admitting: Endocrinology

## 2018-02-02 ENCOUNTER — Other Ambulatory Visit: Payer: Self-pay | Admitting: Adult Health

## 2018-02-02 DIAGNOSIS — J069 Acute upper respiratory infection, unspecified: Secondary | ICD-10-CM

## 2018-04-18 ENCOUNTER — Ambulatory Visit: Payer: 59 | Admitting: Endocrinology

## 2018-04-18 DIAGNOSIS — Z0289 Encounter for other administrative examinations: Secondary | ICD-10-CM

## 2019-01-14 IMAGING — DX DG FOOT COMPLETE 3+V*L*
3 series · 3 of 3 positions shown · non-contrast
Comparison: None.

CLINICAL DATA: Pain after hitting foot on door frame

EXAM:
LEFT FOOT - COMPLETE 3+ VIEW

[foot ap]
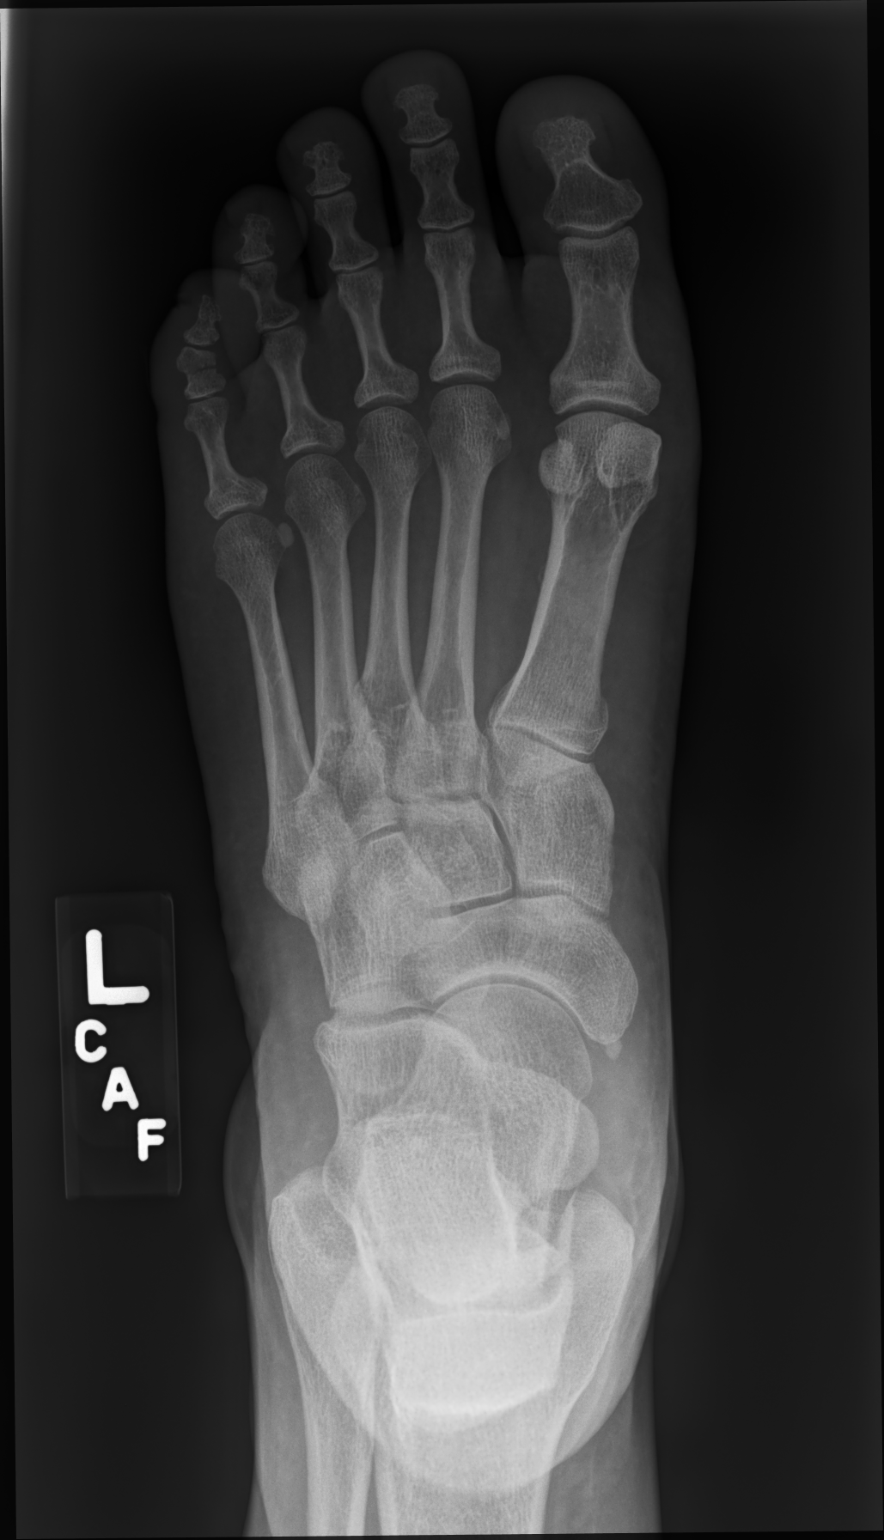

[foot oblique]
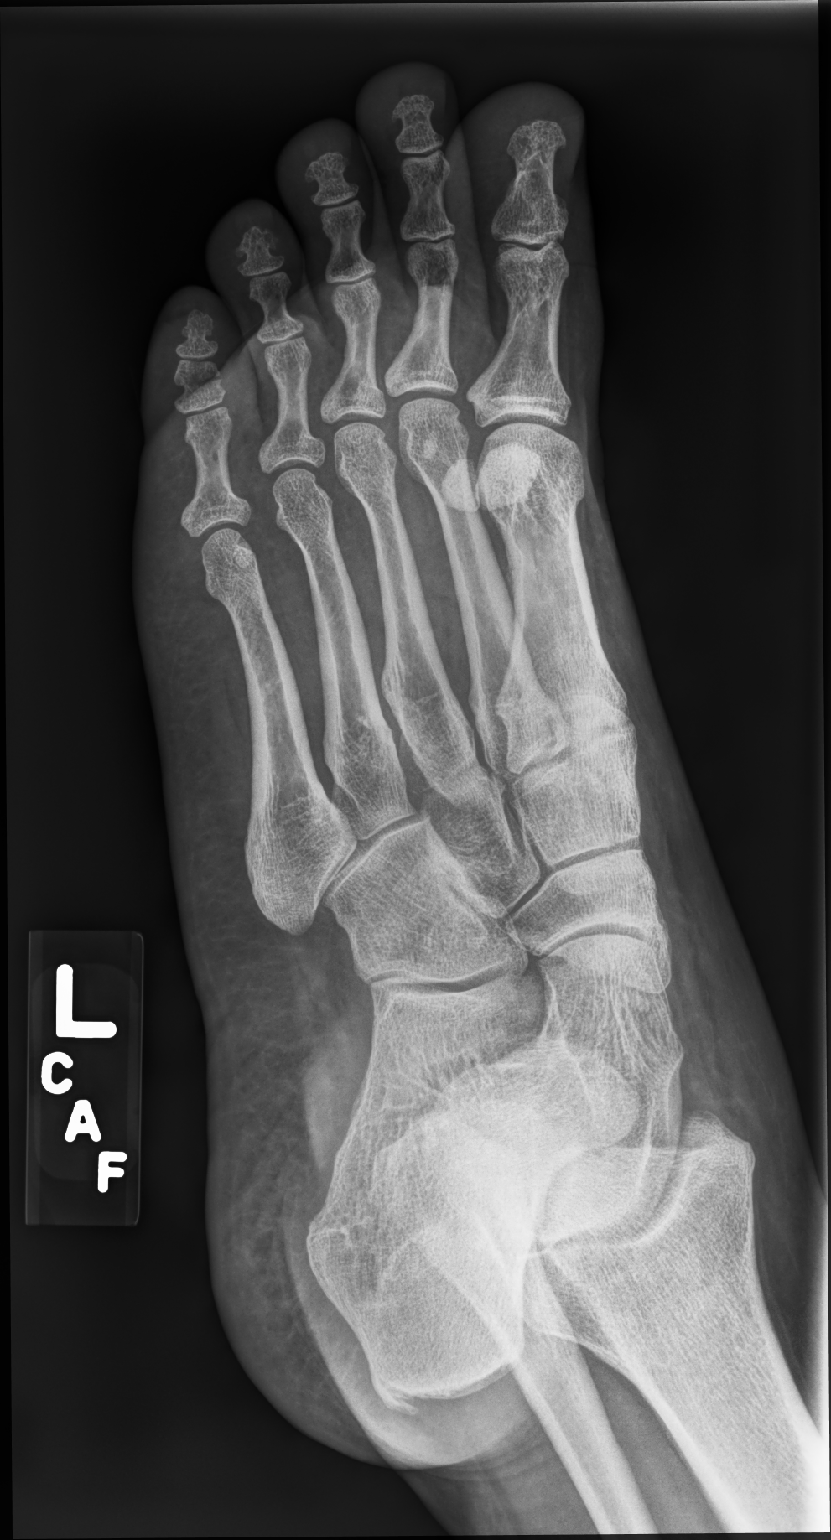

[foot lat]
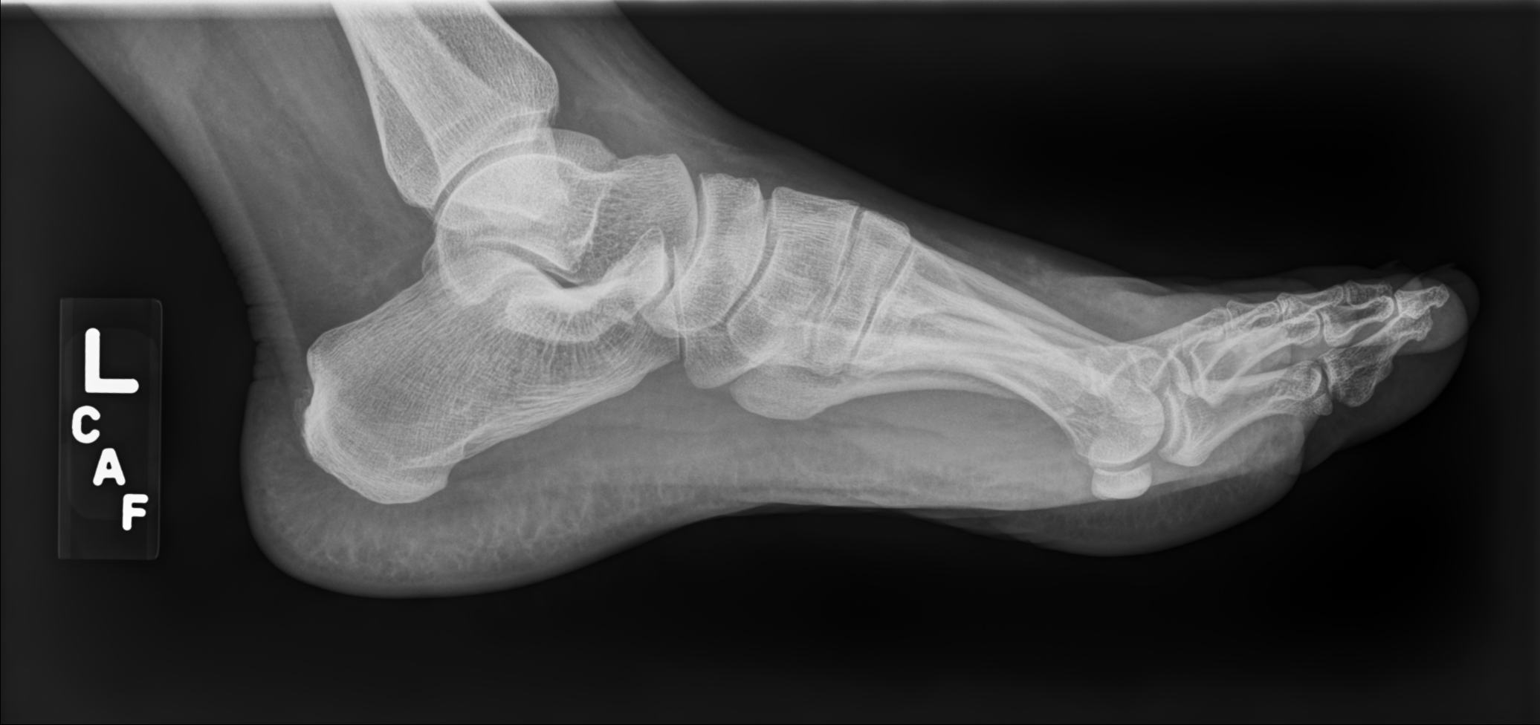

[3 of 3 positions shown; findings below may reference images not displayed]

FINDINGS: Frontal, oblique, and lateral views were obtained. There is a
transversely oriented fracture through the midportion of the fifth
proximal phalanx with alignment near anatomic. No other fracture. No
dislocation. Joint spaces appear normal. No erosive change.
IMPRESSION: Transversely oriented fracture midportion fifth proximal phalanx
with alignment near anatomic. No other fracture. No dislocation. No
appreciable arthropathic change.

These results will be called to the ordering clinician or
representative by the Radiologist Assistant, and communication
documented in the PACS or zVision Dashboard.
